# Patient Record
Sex: Male | Born: 1975 | Race: White | Hispanic: No | Marital: Married
Health system: Midwestern US, Community
[De-identification: ages and names within clinical notes are randomized; demographics above are authoritative.]

## PROBLEM LIST (undated history)

## (undated) DIAGNOSIS — R569 Unspecified convulsions: Secondary | ICD-10-CM

## (undated) DIAGNOSIS — J45909 Unspecified asthma, uncomplicated: Secondary | ICD-10-CM

## (undated) HISTORY — PX: WRIST FRACTURE SURGERY: SHX121

---

## 2017-01-11 ENCOUNTER — Encounter: Attending: Neurology | Primary: Clinical

## 2017-02-15 ENCOUNTER — Ambulatory Visit: Admit: 2017-02-15 | Discharge: 2017-02-15 | Payer: MEDICAID | Attending: Neurology | Primary: Clinical

## 2017-02-15 DIAGNOSIS — G40909 Epilepsy, unspecified, not intractable, without status epilepticus: Secondary | ICD-10-CM

## 2017-02-15 NOTE — Progress Notes (Signed)
[facility] Office Note      Patient:   Jay Jackson  MR#:    [id number]  Account Number:                         Date of Birth:   [birth year]  Date of Evaluation:  02/15/2017  Time of Note:                          1:17 PM  Primary/Referring Physician:  Genia Harold, MD  Consulting Physician:  Wallie Char, DO    NEW PATIENT CONSULTATION    Chief Complaint   Patient presents with   . New Patient     Ref. Dr Baldomero Lamy   . Seizures     Pt states he was started on Abilify and shortly after started having seizures, hx of seizures, has not been on AED therapy in over 10 years.       HISTORY OF PRESENT ILLNESS    Jay Jackson is a 41 y.o. year old male here for possible seizures.  Patient has a prior history of drug abuse.  Seizures started during childhood.  Events described as LOA followed by GTC, no urine incontinence, no tongue biting. Prior TBI history noted, no meningitis or encephalitis.  Does have a family history of seizures.  Has been on Lamictal in the past.  Has been on Keppra in the past as well.  Currently he is not on seizure medications.  Has had three events over the last year, the most recent associated with Abilify injection. He was followed in [place] previously.   Had a rash with Lamictal.     Past Medical History:   Diagnosis Date   . Arthritis    . Asthma    . Chronic pain    . CVA (cerebral vascular accident) (HCC)    . Epilepsy seizure, generalized, convulsive (HCC)    . Heart attack    . Musculoskeletal disorder    . Nasal inflammation due to allergen        Past Surgical History:   Procedure Laterality Date   . LITHOTRIPSY     . WRIST SURGERY Left        Family History   Problem Relation Age of Onset   . Heart Disease Mother    . Emphysema Mother    . Heart Attack Father    . Emphysema Father    . Diabetes Father    . Seizures Brother        Social History     Social History   . Marital status: Married     Spouse name: N/A   . Number of children: N/A   . Years of education: 9     Occupational  History   . Not on file.     Social History Main Topics   . Smoking status: Current Every Day Smoker   . Smokeless tobacco: Never Used   . Alcohol use Not on file   . Drug use: Unknown   . Sexual activity: Yes     Partners: Female     Other Topics Concern   . Not on file     Social History Narrative   . No narrative on file       Current Outpatient Prescriptions   Medication Sig Dispense Refill   . gabapentin (NEURONTIN) 600 MG tablet Take 1 tablet by mouth 3 times  daily..     . tiZANidine (ZANAFLEX) 2 MG tablet Take 1 tablet by mouth 3 times daily     . ibuprofen (ADVIL;MOTRIN) 800 MG tablet Take 1 tablet by mouth 3 times daily as needed     . famotidine (PEPCID) 20 MG tablet Take 1 tablet by mouth 3 times daily       No current facility-administered medications for this visit.        Allergies   Allergen Reactions   . Abilify [Aripiprazole] Other (See Comments)     Seizures   . Lamictal [Lamotrigine] Hives         REVIEW OF SYSTEMS    Constitutional: [] Fever [] Sweats [] Chills []  Recent Injury [] Fatigue  [x]  Denies all unless marked  HEENT:[] Headache  []  Head Injury  []  Sore Throat  []  Ear Pain  [] Dizziness []  Hearing Loss [] Trouble Swallowing [] Voice Change  []  Eye Pain  []  Eye Injection [] Visual Disturbance  []  Ptosis  []  Tinnitus [x]  Denies all unless marked  Spine:  []  Neck pain  [x]  Back pain  [x]  Sciaticia  []  Denies all unless marked  Cardiovascular:[] Chest Pain [] Palpitations []  Heart Disease  [x]  Denies all unless marked  Pulmonary: [] Shortness of Breath [] Cough  [] Wheezing  [x]  Denies all unless marked  Gastrointestinal:  [] Abdominal Pain  [] Blood in Stool  [] Diarrhea [] Constipation [] Nausea  [] Vomiting  [x]  Denies all unless marked  Genitourinary:  []  Dysuria []  Enuresis []  Incontinence []  Frequency/Urgency  []  Hematuria  [x]  Denies all unless marked  Musculoskeletal: [x]  Joint Pain [x]  Myalgias []  Joint Swelling []  Neck Stiffness  []  Denies all unless marked  Skin:[x]  Rash []  Pallor []  Color Change []   Wound  []  Denies all unless marked  Neurological:[]  Visual Disturbance []  Double Vision []  Slurred Speech []  Trouble swallowing  []  Vertigo []  Tingling []  Numbness []  Weakness []  Loss of Balance []  Loss of Consciousness []  Memory Loss []  Tremor [x]  Seizure []  Syncope  []  Ataxia  []  Denies all unless marked  Psychiatric/Behavioral:[]  Depression [x]  Anxiety []  Confusion []  Agitation []  Behavior Problems  []  Hallucinations  []  Suicidal idiation  []  Denies all unless marked  Sleep: []   Insomnia []  Sleep Disturbance []  Snoring []  Restless Legs []  Daytime Sleeping []  Sleep Apnea  [x]  Denies all unless marked  Hematological:[]  Adenopathy []  Bruises/Bleeds Easily  [x]  Denies all unless marked  Endocrine: []  Cold Intolerance []  Heat Intolerance []  Polydipsia []  Polyphagia []  Polyuria  [x] Denies all unless marked  Allergic/Immunologic:[]  Environmental Allergies []  Food Allergies []  Immunocompromised state  [x]  Denies all unless marked    PHYSICAL EXAM  BP 117/79   Pulse 70   Ht 5\' 5"  (1.651 m)   Wt 207 lb (93.9 kg)   BMI 34.45 kg/m     Constitutional - No acute distress    HEENT- Conjunctiva normal.  No scars, masses, or lesions over external nose or ears, no neck masses noted, no jugular vein distension  Cardiovascular - No clubbing, cyanosis, or edema   Pulmonary- Good expansion, normal effort without use of accessory muscles  Musculoskeletal - No significant wasting of muscles noted, no bony deformities  Skin - Warm, dry, and intact.  No rash, erythema, or pallor  Psychiatric - Mood, affect, and behavior appear normal      NEUROLOGICAL EXAM    Mental status   [x] Awake, alert, oriented   [x] Affect attention and concentration appear appropriate  [x] Recent and remote memory appears unremarkable  [x] Speech normal without dysarthria or aphasia, comprehension  and repetition intact.   COMMENTS:    Cranial Nerves [x] No VF deficit to confrontation,  no papilledema on fundoscopic exam.  [x] PERRLA, EOMI, no nystagmus, conjugate  eye movements, no ptosis  [x] Face symmetric  [x] Facial sensation intact  [x] Tongue midline no atrophy or fasciculations present  [x] Palate midline, hearing to finger rub normal bilaterally  [x] Shoulder shrug and SCM testing normal bilaterally  COMMENTS:   Motor   [x] 5/5 strength x 4 extremities  [x] Normal bulk and tone  [x] No tremor present  [x] No rigidity or bradykinesia noted  COMMENTS:   Sensory  [x] Sensation intact to light touch, pin prick, vibration, and proprioception BLE  [] Sensation intact to light touch, pin prick, vibration, and proprioception BUE  COMMENTS:   Coordination [x] FTN normal bilaterally   [x] HTS normal bilaterally  [x] RAM normal bilaterally.   COMMENTS:   Reflexes  [x] Symmetric and non-pathological  [x] Toes down going bilaterally  [x] No clonus present  COMMENTS:   Gait                  [x] Normal steady gait    [] Ataxic    [] Spastic     [] Magnetic     [] Shuffling  COMMENTS:       LABS RECORD AND IMAGING REVIEW (As below and per HPI)    Records reviewed.     ASSESSMENT:    Jay Jackson is a 41 y.o. year old male here for recurrent seizure like episodes.  Not currently on treatment, has failed lamictal and keppra in the past. Question non-epileptic events, question if prior drug use may have played a role. Needs clarified.     PLAN:  1.  MRI Brain.  2.  Video EEG monitoring.  3.  Consider AEDs pending above, hold off for now.   4.  Epilepsy precautions and seizure first aid discussed.  No driving, heights, swimming, tub baths, open flames, or heavy machinery.      Wallie CharJimmy E. Shylee Durrett, DO  Board Certified Neurology

## 2017-02-26 ENCOUNTER — Inpatient Hospital Stay: Admit: 2017-02-26 | Payer: MEDICAID | Primary: Clinical

## 2017-02-26 DIAGNOSIS — G40909 Epilepsy, unspecified, not intractable, without status epilepticus: Secondary | ICD-10-CM

## 2017-02-26 MED ORDER — GADOBENATE DIMEGLUMINE 529 MG/ML IV SOLN
529 MG/ML | Freq: Once | INTRAVENOUS | Status: AC | PRN
Start: 2017-02-26 — End: 2017-02-26
  Administered 2017-02-26: 13:00:00 19 mL via INTRAVENOUS

## 2017-02-26 NOTE — Progress Notes (Signed)
Ok thank you

## 2017-05-18 NOTE — Telephone Encounter (Signed)
Patient called with question on his appointment 05/19/17. The patient had his MRI but not the EEG. The patient is scheduled for that on 05/31/17. Cancelled tomorrows appointment and rescheduled for the week after.

## 2017-05-19 ENCOUNTER — Encounter: Attending: Neurology | Primary: Clinical

## 2017-05-31 ENCOUNTER — Observation Stay
Admit: 2017-05-31 | Discharge: 2017-06-02 | Disposition: A | Discharge status: Home / Self Care | Payer: MEDICAID | Source: Ambulatory Visit | Attending: Neurology | Admitting: Neurology | Primary: Clinical

## 2017-05-31 DIAGNOSIS — R569 Unspecified convulsions: Secondary | ICD-10-CM

## 2017-05-31 MED ORDER — LORAZEPAM 2 MG/ML IJ SOLN
2 MG/ML | INTRAMUSCULAR | Status: DC | PRN
Start: 2017-05-31 — End: 2017-06-02

## 2017-05-31 MED ORDER — NORMAL SALINE FLUSH 0.9 % IV SOLN
0.9 % | Freq: Two times a day (BID) | INTRAVENOUS | Status: DC
Start: 2017-05-31 — End: 2017-06-02

## 2017-05-31 MED ORDER — TIZANIDINE HCL 4 MG PO TABS
4 MG | Freq: Three times a day (TID) | ORAL | Status: DC | PRN
Start: 2017-05-31 — End: 2017-06-02
  Administered 2017-06-01 – 2017-06-02 (×2): 4 mg via ORAL

## 2017-05-31 MED ORDER — NICOTINE 21 MG/24HR TD PT24
21 MG/24HR | Freq: Every day | TRANSDERMAL | Status: DC
Start: 2017-05-31 — End: 2017-06-02
  Administered 2017-05-31 – 2017-06-02 (×3): 1 via TRANSDERMAL

## 2017-05-31 MED ORDER — GABAPENTIN 600 MG PO TABS
600 MG | Freq: Three times a day (TID) | ORAL | Status: DC
Start: 2017-05-31 — End: 2017-06-02
  Administered 2017-05-31 – 2017-06-02 (×6): 600 mg via ORAL

## 2017-05-31 MED ORDER — MAGNESIUM HYDROXIDE 400 MG/5ML PO SUSP
400 | Freq: Every day | ORAL | Status: DC | PRN
Start: 2017-05-31 — End: 2017-06-02

## 2017-05-31 MED ORDER — FAMOTIDINE 20 MG PO TABS
20 MG | Freq: Two times a day (BID) | ORAL | Status: DC
Start: 2017-05-31 — End: 2017-06-02
  Administered 2017-06-01 – 2017-06-02 (×4): 20 mg via ORAL

## 2017-05-31 MED ORDER — ENOXAPARIN SODIUM 40 MG/0.4ML SC SOLN
40 MG/0.4ML | Freq: Every day | SUBCUTANEOUS | Status: DC
Start: 2017-05-31 — End: 2017-06-02
  Administered 2017-06-01: 03:00:00 40 mg via SUBCUTANEOUS

## 2017-05-31 MED ORDER — IBUPROFEN 400 MG PO TABS
400 MG | Freq: Three times a day (TID) | ORAL | Status: DC | PRN
Start: 2017-05-31 — End: 2017-06-02
  Administered 2017-06-01 – 2017-06-02 (×2): 800 mg via ORAL

## 2017-05-31 MED ORDER — NORMAL SALINE FLUSH 0.9 % IV SOLN
0.9 % | INTRAVENOUS | Status: DC | PRN
Start: 2017-05-31 — End: 2017-06-02

## 2017-05-31 MED ORDER — ONDANSETRON HCL 4 MG/2ML IJ SOLN
4 MG/2ML | Freq: Four times a day (QID) | INTRAMUSCULAR | Status: DC | PRN
Start: 2017-05-31 — End: 2017-06-02

## 2017-05-31 NOTE — Progress Notes (Signed)
4 Eyes Skin Assessment    Jay Jackson is being assessed upon: Admission    I agree that I, [name], along with Cyndee Brightly, RN (either 2 RN's or 1 LPN and 1 RN) have performed a thorough Head to Toe Skin Assessment on the patient. ALL assessment sites listed below have been assessed.      Areas assessed by both nurses:     [x]    Head, Face, and Ears   [x]    Shoulders, Back, and Chest  [x]    Arms, Elbows, and Hands   [x]    Coccyx, Sacrum, and Ischium  [x]    Legs, Feet, and Heels    Does the Patient have Skin Breakdown? No    Braden Prevention initiated: No  Wound Care Orders initiated: No    WOC nurse consulted for Pressure Injury (Stage 3,4, Unstageable, DTI, NWPT, and Complex wounds) and New or Established Ostomies: No        Primary Nurse eSignature: [name], RN on 05/31/2017 at 9:35 AM      Co-Signer eSignature:

## 2017-05-31 NOTE — Plan of Care (Signed)
Problem: Falls - Risk of:  Goal: Will remain free from falls  Will remain free from falls  Outcome: Ongoing    Goal: Absence of physical injury  Absence of physical injury  Outcome: Ongoing

## 2017-05-31 NOTE — Progress Notes (Signed)
[place] arrived to room # 334.   Presented with: EEG monitoring   Mental Status: Patient is oriented and alert.   Vitals:    05/31/17 0853   BP: 126/83   Pulse: 63   Resp: 16   Temp: 97.5 F (36.4 C)   SpO2: 96%     Patient safety contract and falls prevention contract reviewed with patient Yes.  Oriented Patient to room.  Call light within reach. Yes.  Needs, issues or concerns expressed at this time: no.      Electronically signed by [name], RN on 05/31/2017 at 9:34 AM

## 2017-05-31 NOTE — H&P (Signed)
HISTORY AND PHYSICAL      Patient:   Jay Jackson  MR#:    [id number]  Account Number:                   [id number]      Room:    0334/334-01   Date of Birth:   Oct 06, 1975  Date of Progress Note: 05/31/2017  Time of Note                           12:23 PM  Attending Physician:  Wallie Char, D.O.       CHIEF COMPLAINT:  Convulsions    HISTORY OF PRESENT ILLNESS:   This 41 y.o. male who presents with possible seizures.   He has a prior history of drug abuse. His seizure started during childhood. Events are described as loss of awareness followed by generalized tonic-clonic convulsive activity. He denies incontinence or tongue biting. He does have a prior treatment brain injury history. There is no meningitis or encephalitis. There is no clear family history of seizures. He is currently not on antiepileptic medications. He is being admitted to epilepsy monitoring unit for event clarification.    REVIEW OF SYSTEMS    Constitutional:  Denies fever or chills   Eyes:  Denies change in visual acuity   HENT:  Denies nasal congestion or sore throat   Respiratory:  Denies cough or shortness of breath   Cardiovascular:  Denies chest pain or edema   GI:  Denies abdominal pain, nausea, vomiting, bloody stools or diarrhea   GU:  Denies dysuria   Musculoskeletal:  Denies back pain or joint pain   Integument:  Denies rash   Neurologic:  Denies headache, focal weakness or sensory changes   Endocrine:  Denies polyuria or polydipsia   Lymphatic:  Denies swollen glands   Psychiatric:  Dpression / anxiety  Noted, no SI/HI.    All other review of systems are negative.    Past Medical History:  Past Medical History:   Diagnosis Date   . Arthritis    . Asthma    . Chronic pain    . CVA (cerebral vascular accident) (HCC)    . Epilepsy seizure, generalized, convulsive (HCC)    . Heart attack (HCC)    . Musculoskeletal disorder    . Nasal inflammation due to allergen        Past Surgical History:      Procedure Laterality Date   .  LITHOTRIPSY     . WRIST SURGERY Left        Social History:   TOBACCO:   reports that he has been smoking.  He has never used smokeless tobacco.  ETOH:   has no alcohol history on file.  DRUG:  None    Family History:   Family History   Problem Relation Age of Onset   . Heart Disease Mother    . Emphysema Mother    . Heart Attack Father    . Emphysema Father    . Diabetes Father    . Seizures Brother        Medications:      Current Facility-Administered Medications:   .  famotidine (PEPCID) tablet 20 mg, 20 mg, Oral, BID, Voncille Lo, DO  .  gabapentin (NEURONTIN) tablet 600 mg, 600 mg, Oral, TID, Voncille Lo, DO  .  ibuprofen (ADVIL;MOTRIN) tablet 800 mg, 800 mg, Oral, TID PRN, Rosanne Ashing  Ed Knute Neu, DO  .  tiZANidine (ZANAFLEX) tablet 4 mg, 4 mg, Oral, Q8H PRN, Voncille Lo, DO  .  nicotine (NICODERM CQ) 21 MG/24HR 1 patch, 1 patch, Transdermal, Daily, Voncille Lo, DO  .  sodium chloride flush 0.9 % injection 10 mL, 10 mL, Intravenous, 2 times per day, Voncille Lo, DO  .  sodium chloride flush 0.9 % injection 10 mL, 10 mL, Intravenous, PRN, Voncille Lo, DO  .  magnesium hydroxide (MILK OF MAGNESIA) 400 MG/5ML suspension 30 mL, 30 mL, Oral, Daily PRN, Voncille Lo, DO  .  ondansetron [facility]) injection 4 mg, 4 mg, Intravenous, Q6H PRN, Voncille Lo, DO  .  enoxaparin (LOVENOX) injection 40 mg, 40 mg, Subcutaneous, Daily, Voncille Lo, DO  .  LORazepam (ATIVAN) injection 1 mg, 1 mg, Intravenous, PRN, Voncille Lo, DO    Allergies:  Abilify [aripiprazole] and Lamictal [lamotrigine]    PHYSICAL EXAM:    Constitutional -   BP 126/83   Pulse 63   Temp 97.5 F (36.4 C) (Temporal)   Resp 16   Ht  (1.651 m)   Wt 194 lb (88 kg)   SpO2 96%   BMI 32.28 kg/m   General appearance: No acute distress   EYES -   Conjunctiva normal  Pupillary exam as below, see CN exam in the neurologic exam  ENT-    No scars, masses, or lesions over external nose or ears  Hearing normal bilaterally to finger rub  Cardiovascular -    RRR  No clubbing, cyanosis, or edema   Respiratory-   Good expansion, normal effort without use of accessory muscles  CTA  Musculoskeletal -   No significant wasting of muscles noted  Gait as below, see gait exam in the neurologic exam  Muscle strength, tone, stability as below.   No bony deformities  Skin -   Warm, dry, and intact to inspection and palpation.    No rash, erythema, or pallor  Psychiatric -   Mood, affect, and behavior appear normal    Memory as below see mental status examination in the neurologic exam      NEUROLOGICAL EXAM    Mental status   Awake, alert, oriented   Affect attention and concentration appear appropriate  Recent and remote memory appears unremarkable  Speech normal without dysarthria or aphasia, comprehension and repetition intact.   COMMENTS:    Cranial Nerves No VF deficit to confrontation,  no papilledema on fundoscopic exam.  PERRLA, EOMI, no nystagmus, conjugate eye movements, no ptosis  Face symmetric  Facial sensation intact  Tongue midline no atrophy or fasciculations present  Palate midline, hearing to finger rub normal  Shoulder shrug and SCM testing normal  COMMENTS:   Motor   5/5 strength x 4 extremities  Normal bulk and tone  No tremor present  No rigidity or bradykinesia noted  COMMENTS:   Sensory  Sensation intact to light touch, pin prick, vibration, and proprioception BLE  Sensation intact to light touch, pin prick, vibration, and proprioception BUE  COMMENTS:   Coordination FTN normal bilaterally   HTS normal bilaterally  RAM normal.   COMMENTS:   Reflexes  Symmetric and non-pathological  Toes down going bilaterally  No clonus present  COMMENTS:   Gait                  Normal steady gait    Ataxic    Spastic       Magnetic     [] Shuffling  COMMENTS:     LABS AND IMAGING:  As below and per HPI.    MRI brain with chiari otherwise negative.     ASSESSMENT:  41 y.o. here with recurrent seizure  like episodes and admitted for epilepsy monitoring and event clarification.     PLAN:  1.  Video EEG monitoring 48-72 hours.   2.  Seizure precautions, prn ativan.  3.  Final AED recommendations pending above.   4.  Continue home medication regimen, not currently on AEDs.     Ellison Carwin, DO  Board Certified Neurologist

## 2017-06-01 NOTE — Plan of Care (Signed)
Problem: Falls - Risk of:  Goal: Will remain free from falls  Will remain free from falls   Outcome: Ongoing    Goal: Absence of physical injury  Absence of physical injury   Outcome: Ongoing      Problem: Safety:  Goal: Free from accidental physical injury  Free from accidental physical injury   Outcome: Ongoing    Goal: Free from intentional harm  Free from intentional harm   Outcome: Ongoing    Goal: Ability to remain free from injury will improve  Ability to remain free from injury will improve   Outcome: Ongoing

## 2017-06-01 NOTE — Progress Notes (Signed)
Patient called out stating he had a seizure, not sure how long it lasted states "i was watching tv and all of a sudden it hit" Time frame between 0700-0720. Will send message to Dr. Knute Neuouch

## 2017-06-01 NOTE — Plan of Care (Signed)
Problem: Falls - Risk of:  Goal: Will remain free from falls  Will remain free from falls   Outcome: Ongoing    Goal: Absence of physical injury  Absence of physical injury   Outcome: Ongoing      Problem: Infection:  Goal: Will remain free from infection  Will remain free from infection   Outcome: Ongoing      Problem: Safety:  Goal: Free from accidental physical injury  Free from accidental physical injury   Outcome: Ongoing    Goal: Free from intentional harm  Free from intentional harm   Outcome: Ongoing    Goal: Ability to remain free from injury will improve  Ability to remain free from injury will improve   Outcome: Ongoing      Problem: Daily Care:  Goal: Daily care needs are met  Daily care needs are met   Outcome: Ongoing      Problem: Pain:  Goal: Patient's pain/discomfort is manageable  Patient's pain/discomfort is manageable   Outcome: Ongoing      Problem: Skin Integrity:  Goal: Skin integrity will stabilize  Skin integrity will stabilize   Outcome: Ongoing      Problem: Discharge Planning:  Goal: Patients continuum of care needs are met  Patients continuum of care needs are met   Outcome: Ongoing      Problem: ABCDS Injury Assessment  Goal: Absence of physical injury  Outcome: Ongoing      Problem: Coping:  Goal: Ability to cope will improve  Ability to cope will improve   Outcome: Ongoing    Goal: Ability to identify appropriate support needs will improve  Ability to identify appropriate support needs will improve   Outcome: Ongoing      Problem: Health Behavior:  Goal: Ability to manage health-related needs will improve  Ability to manage health-related needs will improve   Outcome: Ongoing      Problem: Physical Regulation:  Goal: Signs of adequate cerebral perfusion will increase  Signs of adequate cerebral perfusion will increase   Outcome: Ongoing    Goal: Ability to maintain a stable neurologic state will improve  Ability to maintain a stable neurologic state will improve   Outcome:  Ongoing      Problem: Self-Concept:  Goal: Level of anxiety will decrease  Level of anxiety will decrease   Outcome: Ongoing    Goal: Ability to verbalize feelings about condition will improve  Ability to verbalize feelings about condition will improve   Outcome: Ongoing

## 2017-06-01 NOTE — Progress Notes (Signed)
[facility] Progress Note      Patient:   Jay Jackson  MR#:    [id number]   Room:    1478/295-62   Date of Birth:   1975/11/14  Date of Progress Note: 06/01/2017  Time of Note                           8:33 AM  Attending Physician:  Voncille Lo, DO      INTERVAL HISTORY:  No events overnight, did have a brief spell this am, no acute events, no new complaints this am outside of mild dizziness.     REVIEW OF SYSTEMS:  Constitutional: No fevers No chills  Neck:No stiffness  Respiratory: No shortness of breath  Cardiovascular: No chest pain No palpitations  Gastrointestinal: No abdominal pain    Genitourinary: No Dysuria  Neurological: No headache, no confusion    PHYSICAL EXAM:    Constitutional -   BP 105/76   Pulse 62   Temp 98.8 F (37.1 C)   Resp 20   Ht  (1.651 m)   Wt 194 lb (88 kg)   SpO2 95%   BMI 32.28 kg/m   General appearance: No acute distress   EYES -   Conjunctiva normal  Pupillary exam as below, see CN exam in the neurologic exam  ENT-    No scars, masses, or lesions over external nose or ears  Hearing normal bilaterally to finger rub  Neck-   No neck masses noted  Thyroid normal   No jugular vein distension  Cardiovascular -   RRR  No clubbing, cyanosis, or edema   Pulmonary-   Good expansion, normal effort without use of accessory muscles  CTA  Musculoskeletal -   No significant wasting of muscles noted  Gait as below, see gait exam in the neurologic exam  Muscle strength, tone, stability as below see the motor exam in the neurologic exam.   No bony deformities  Skin -   Warm, dry, and intact to inspection and palpation.    No rash, erythema, or pallor  Psychiatric -   Mood, affect, and behavior appear normal    Memory as below see mental status examination in the neurologic exam      NEUROLOGICAL EXAM    Mental status    Awake, alert, oriented    Affect attention and concentration appear appropriate   Recent and remote memory appears unremarkable   Speech normal  without dysarthria or aphasia, comprehension and repetition intact.   COMMENTS:   Cranial Nerves  No VF deficit to confrontation,  no papilledema on fundoscopic exam.   PERRLA, EOMI, no nystagmus, conjugate eye movements, no ptosis   Face symmetric   Facial sensation intact   Tongue midline no atrophy or fasciculations present   Palate midline, hearing to finger rub normal   Shoulder shrug and SCM testing normal  COMMENTS:   Motor    5/5 strength x 4 extremities   Normal bulk and tone   No tremor present   No rigidity or bradykinesia noted  COMMENTS:   Sensory   Sensation intact to light touch, pin prick, vibration, and proprioception BLE   Sensation intact to light touch, pin prick, vibration, and proprioception BUE  COMMENTS:   Coordination  FTN normal bilaterally    HTS normal bilaterally   RAM normal.   COMMENTS:   Reflexes   Symmetric and non-pathological   Toes downgoing  bilaterally  [x]  No clonus present  COMMENTS:   Gait                  []  Normal steady gait    []  Ataxic    []  Spastic     []  Magnetic     []  Shuffling  [x]  Not assessed  COMMENTS:       RECORD REVIEW:   Previous medical records, medications were reviewed at today's visit.  Nursing/physician notes, imaging, labs and vitals reviewed.   PT,OT and/or speech notes reviewed      ASSESSMENT:  41 y.o. here with recurrent seizure like episodes and admitted for epilepsy monitoring and event clarification. Only a single brief event captured thus far.     PLAN:  1.  Continue Video EEG monitoring 72-96 hours.   2.  Seizure precautions, prn ativan.  3.  Final AED recommendations pending above.   4.  Continue home medication regimen, not currently on AEDs.       Please feel free to call with any questions.   [phone] (cell phone).    Ellison Carwin, DO  Board Certified Neurology

## 2017-06-02 NOTE — Plan of Care (Signed)
Problem: Falls - Risk of:  Goal: Will remain free from falls  Will remain free from falls   Outcome: Ongoing    Goal: Absence of physical injury  Absence of physical injury   Outcome: Ongoing      Problem: Infection:  Goal: Will remain free from infection  Will remain free from infection   Outcome: Ongoing      Problem: Safety:  Goal: Free from accidental physical injury  Free from accidental physical injury   Outcome: Ongoing    Goal: Free from intentional harm  Free from intentional harm   Outcome: Ongoing    Goal: Ability to remain free from injury will improve  Ability to remain free from injury will improve   Outcome: Ongoing      Problem: Daily Care:  Goal: Daily care needs are met  Daily care needs are met   Outcome: Ongoing      Problem: Pain:  Goal: Patient's pain/discomfort is manageable  Patient's pain/discomfort is manageable   Outcome: Ongoing      Problem: Skin Integrity:  Goal: Skin integrity will stabilize  Skin integrity will stabilize   Outcome: Ongoing      Problem: Discharge Planning:  Goal: Patients continuum of care needs are met  Patients continuum of care needs are met   Outcome: Ongoing      Problem: ABCDS Injury Assessment  Goal: Absence of physical injury  Outcome: Ongoing      Problem: Coping:  Goal: Ability to cope will improve  Ability to cope will improve   Outcome: Ongoing    Goal: Ability to identify appropriate support needs will improve  Ability to identify appropriate support needs will improve   Outcome: Ongoing      Problem: Health Behavior:  Goal: Ability to manage health-related needs will improve  Ability to manage health-related needs will improve   Outcome: Ongoing      Problem: Physical Regulation:  Goal: Signs of adequate cerebral perfusion will increase  Signs of adequate cerebral perfusion will increase   Outcome: Ongoing    Goal: Ability to maintain a stable neurologic state will improve  Ability to maintain a stable neurologic state will improve   Outcome:  Ongoing      Problem: Self-Concept:  Goal: Level of anxiety will decrease  Level of anxiety will decrease   Outcome: Ongoing    Goal: Ability to verbalize feelings about condition will improve  Ability to verbalize feelings about condition will improve   Outcome: Ongoing

## 2017-06-02 NOTE — Discharge Summary (Signed)
EPILEPSY MONITORING UNIT DISCHARGE SUMMARY      Patient Name: Jay Jackson    DOB:  Apr 02, 1976    MRN:  [id number]  Admit date:  05/31/2017    Discharge date:  06/02/2017  Admitting Physician:  Voncille LoJim Ed Zeena Starkel, DO    Primary Care Physician:  Genia HaroldSandy K Stringer    Discharge Diagnoses:  Convulsions    Diagnostic Studies: 24 hour video EEG monitoring    Hospital Course: The patient was admitted to epilepsy monitoring unit. Multiple brief confusional episodes were captured. None of which were associated with loss of awareness or clinical seizure activity. No clear epileptiform MRIs were noted on the EEG. Patient be discharged home today. He'll follow-up with me in 1-2 weeks. Final review his interictal EEG will be completed. Final antiepileptic drug recommendations we made at that time. She will continue to follow event precautions as outlined below.    Discharge Medications:      Jay Pernaeal, [place]   Home Medication Instructions JYN:[id number]    Printed on:06/02/17 1202   Medication Information                      famotidine (PEPCID) 20 MG tablet  Take 1 tablet by mouth 3 times daily             gabapentin (NEURONTIN) 600 MG tablet  Take 1 tablet by mouth 3 times daily.Marland Kitchen.             ibuprofen (ADVIL;MOTRIN) 800 MG tablet  Take 1 tablet by mouth 3 times daily as needed             tiZANidine (ZANAFLEX) 2 MG tablet  Take 4 mg by mouth 2 times daily                  Discharge Instructions:   Follow up with Ellison CarwinJimmy Trase Bunda in 7 days.  Take medications as directed.  Resume activity as tolerated.  Epilepsy precautions and seizure first aid discussed.  No driving, heights, swimming, tub baths, open flames, or heavy machinery.      Disposition: Patient is medically stable and will be discharged home.    Time spent on discharge 20 min.      Ellison CarwinJimmy Samuel Rittenhouse, DO  Board Certified Neurologist

## 2017-06-02 NOTE — Discharge Instructions (Signed)
As tolerated.

## 2017-06-02 NOTE — Procedures (Signed)
ADULT INPATIENT VIDEO-EEG EVENT MONITORING REPORT    Patient:   Jay Jackson  MR#:    [id number]  Room #:    INPATIENT   Date of Birth:   1976-08-26  Study Date:    September 24-26, 2018  Primary Physician:     Genia HaroldSandy K Stringer   Referring Physician:   Ellison CarwinJimmy Hollin Crewe, DO      CLINICAL INFORMATION:     This patient is a 41 y.o. male with a history of possible seizures.  The specific reason we are doing this study is for event clarification and to evaluate for an underlying seizure focus.     MEDICATIONS:     See MAR    RECORDING CONDITIONS:     Continuous video-EEG monitoring was performed with [employer] equiptment. The recorded period extended from September 24-26, 2018. Electrodes were applied according to the International 10-20 System. Data were obtained, stored, and interpreted according to ACNS guidelines (J Clin Neurophysiol 2006;23(2):85-183) utilizing referential montage recording, with reformatting to longitudinal, transverse bipolar, and referential montages as necessary for interpretation, along with digital/automated EEG analysis. The patient tolerated the entire procedure well.     DESCRIPTION OF BASELINE RECORD:    During the waking state, the predominant posterior resting frequency was rhythmic, symmetric, 9-10 Hz, 30-40 uV rhythm with reactivity to eye opening and closure demonstrated. Drowsiness was demonstrated by slow rolling eye movements followed by loss of background waking activities. Onset of Stage I sleep was demonstrated by gradual disappearance of background waking rhythms and the onset of gradual symmetric mixed-frequency 4-7 Hz slowing. Stage II sleep was characterized by vertex transients, sleep-spindles, and K-complexes, at times with shifting asymmetry demonstrated. Stage III and IV of sleep were characterized by progressive proportion of high voltage slowing in the delta frequency. Photic stimulation was performed at frequencies between 1-30 Hz demonstrating symmetric bioccipital driving  responses at a wide range of frequencies. No ECG abnormalities were noted. Muscle, motion, and eye movement artifacts were occasionally noted.     DESCRIPTION OF EVENTS:    May 31, 2017  The Interical files were as described in trhe baseline recording.   Digital spike and event analysis reviewed and showed no abnormalities.   No events were recorded.     June 01, 2017  The Interical files were as described in trhe baseline recording.   Digital spike and event analysis reviewed and showed no abnormalities.   No events were recorded.     June 02, 2017  The Interical files were as described in trhe baseline recording.   Digital spike and event analysis reviewed and showed no abnormalities.   No events were recorded.       E.E.G. INTERPRETATION:    1. Interictal Abnormalities: None.     2. Ictal Events: None.     3. Non-Epileptic Events: Multiple brief confusional episodes were captured.  No overt EEG abnormalities were noted.       CLINICAL CORRELATION:    This 3-day study was non-diagnostic.  The patients typical events were captured. No interictal abnormalities were noted.  The events that were captured appeared non-epileptic. The absence of epileptiform abnormalities does not preclude a clinical diagnosis of seizures.        Ellison CarwinJimmy Sherion Dooly, DO  Board Certified Neurologist    Date Reported: 06/02/2017  Date Signed: 06/02/2017

## 2017-06-02 NOTE — Discharge Instructions (Signed)
Epilepsy precautions and seizure first aid discussed.  No driving, heights, swimming, tub baths, open flames, or heavy machinery.

## 2017-06-08 ENCOUNTER — Ambulatory Visit: Admit: 2017-06-08 | Discharge: 2017-06-08 | Payer: MEDICAID | Attending: Neurology | Primary: Clinical

## 2017-06-08 DIAGNOSIS — R569 Unspecified convulsions: Secondary | ICD-10-CM

## 2017-06-08 NOTE — Progress Notes (Signed)
[facility] Office Note      Patient:   Jay Jackson  MR#:    [id number]  Account Number:                         Date of Birth:   1976/02/23  Date of Evaluation:  06/08/2017  Time of Note:                          11:30 AM  Primary/Referring Physician:  Genia Harold, MD  Consulting Physician:  Wallie Char, DO    FOLLOW UP VISIT    Chief Complaint   Patient presents with   . Seizures     3 month follow up last seizure was 06-03-2017   . Results     Results from EEG       HISTORY OF PRESENT ILLNESS    Jay Jackson is a 41 y.o. year old male here for possible seizures.  Patient has a prior history of drug abuse.  Seizures started during childhood.  Events described as LOA followed by GTC, no urine incontinence, no tongue biting. Prior TBI history noted, no meningitis or encephalitis.  Does have a family history of seizures.  Has been on Lamictal in the past.  Has been on Keppra in the past as well.  Video EEG completed and largely negative, a couple brief confusional episodes captured, without EEG abnormalities noted.  Only a couple brief events since discharged.  Currently he is not on seizure medications.  Has had three events over the last year, the most recent associated with Abilify injection. He was followed in [place] previously.   Had a rash with Lamictal. No other complaints today. 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     Past Medical History:   Diagnosis Date   . Arthritis    . Asthma    . Chronic pain    . CVA (cerebral vascular accident) (HCC)    . 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 Epilepsy seizure, generalized, convulsive (HCC)    . Heart attack (HCC)    . Musculoskeletal disorder    . Nasal inflammation due to allergen        Past Surgical History:   Procedure Laterality Date   . LITHOTRIPSY     . WRIST SURGERY Left        Family History   Problem Relation Age of Onset   . Heart Disease Mother    . Emphysema Mother    . Heart Attack Father    . Emphysema Father    .  Diabetes Father    . Seizures Brother        Social History     Social History   . Marital status: Married     Spouse name: N/A   . Number of children: N/A   . Years of education: 9     Occupational History   . Not on file.     Social History Main Topics   . Smoking status: Current Every Day Smoker   . Smokeless tobacco: Never Used   . Alcohol use No   . Drug use: No      Comment: prior substant abbuse/meth    . Sexual activity: Yes     Partners: Female     Other Topics Concern   . Not on file  Social History Narrative   . No narrative on file       Current Outpatient Prescriptions   Medication Sig Dispense Refill   . albuterol (PROVENTIL) (2.5 MG/3ML) 0.083% nebulizer solution as needed     . cetirizine-psuedoephedrine (ZYRTEC-D) 5-120 MG per extended release tablet as needed     . gabapentin (NEURONTIN) 600 MG tablet Take 1 tablet by mouth 3 times daily..     . tiZANidine (ZANAFLEX) 2 MG tablet Take 4 mg by mouth 2 times daily      . ibuprofen (ADVIL;MOTRIN) 800 MG tablet Take 1 tablet by mouth 3 times daily as needed     . famotidine (PEPCID) 20 MG tablet Take 1 tablet by mouth 3 times daily       No current facility-administered medications for this visit.        Allergies   Allergen Reactions   . Abilify [Aripiprazole] Other (See Comments)     Seizures   . Lamictal [Lamotrigine] Hives     REVIEW OF SYSTEMS    Constitutional: Fever Sweat Chills  Recent Injury  Denies all unless marked  HEENT:[] Headache   Head Injury/Hearing Loss   Sore Throat   Ear Ache/Dizziness   Denies all unless marked  Spine:   Neck pain   Back pain   Sciaticia   Denies all unless marked  Cardiovascular:[] Heart Disease Chest Pain  Palpitations   Denies all unless marked  Pulmonary: Shortness of Breath Cough    Denies all unless marke  Gastrointestinal: Nausea  Vomiting  Abdominal Pain  Constipation  Diarrhea  Dark Bloody Stools   Denies all unless  marked  Psychiatric/Behavioral:[]  Depression  Anxiety  Denies all unless marked  Genitourinary:    Frequency   Urgency   Incontinence  Pain with Urination   Denies all unless marked  Extremities: Pain  Swelling   Denies all unless marked  Musculoskeletal:  Muscle Pain   Joint Pain   Arthritis  Muscle Cramps  Muscle Twitches   Denies all unless marked  Sleep:  Insomnia  Snoring  Restless Legs  Sleep Apnea   Daytime Sleepiness   Denies all unless marked  Skin:[]  Rash  Skin Discoloration  Denies all unless marked   Neurological: Visual Disturbance/Memory Loss  Loss of Balance  Slurred Speech/Weakness  Seizures   Vertigo/Dizziness  Denies all unless marked    ROS reviewed, agree with above.       PHYSICAL EXAM  Constitutional -   BP 124/68   Pulse 82   Ht  (1.651 m)   Wt 195 lb 9.6 oz (88.7 kg)   SpO2 96%   BMI 32.55 kg/m   General appearance: No acute distress   EYES -   Conjunctiva normal  Pupillary exam as below, see CN exam in the neurologic exam  ENT-    No scars, masses, or lesions over external nose or ears  Hearing normal bilaterally to finger rub  Cardiovascular -   RRR  No clubbing, cyanosis, or edema   Respiratory-   Good expansion, normal effort without use of accessory muscles  CTA  Musculoskeletal -   No significant wasting of muscles noted  Gait as below, see gait exam in the neurologic exam  Muscle strength, tone, stability as below.   No bony deformities  Skin -   Warm, dry, and intact to inspection and palpation.    No rash, erythema, or pallor  Psychiatric -   Mood, affect, and behavior appear normal  Memory as below see mental status examination in the neurologic exam      NEUROLOGICAL EXAM    Mental status   Awake, alert, oriented   Affect attention and concentration appear appropriate  Recent and remote memory appears unremarkable  Speech normal without dysarthria or aphasia, comprehension and repetition  intact.   COMMENTS:    Cranial Nerves No VF deficit to confrontation,  no papilledema on fundoscopic exam.  PERRLA, EOMI, no nystagmus, conjugate eye movements, no ptosis  Face symmetric  Facial sensation intact  Tongue midline no atrophy or fasciculations present  Palate midline, hearing to finger rub normal bilaterally  Shoulder shrug and SCM testing normal bilaterally  COMMENTS:   Motor   5/5 strength x 4 extremities  Normal bulk and tone  No tremor present  No rigidity or bradykinesia noted  COMMENTS:   Sensory  Sensation intact to light touch, pin prick, vibration, and proprioception BLE  Sensation intact to light touch, pin prick, vibration, and proprioception BUE  COMMENTS:   Coordination FTN normal bilaterally   HTS normal bilaterally  RAM normal bilaterally.   COMMENTS:   Reflexes  Symmetric and non-pathological  Toes down going bilaterally  No clonus present  COMMENTS:   Gait                  Normal steady gait    Ataxic    Spastic     Magnetic     Shuffling  COMMENTS:       LABS RECORD AND IMAGING REVIEW (As below and per HPI)    Records reviewed.     Video EEG reviewed.     MRI reviewed.     ASSESSMENT:    Jay Jackson is a 41 y.o. year old male here for recurrent seizure like episodes.  Video EEG was largely negative, brief non-epileptic events captured. Not currently on treatment, has failed lamictal and keppra in the past. Question if prior drug use may have played a role.  MRI c/w chiari, otherwise negative.     PLAN:  1.  Hold off on AEDs for now given negative workup.  2.  Follow chiari, consider surgery evaluation, but currently not felt symptomatic.    3.  Event precautions discussed.  No driving, heights, swimming, tub baths, open flames, or heavy machinery.      Wallie Char, DO  Board Certified Neurology

## 2017-10-11 ENCOUNTER — Ambulatory Visit: Admit: 2017-10-11 | Discharge: 2017-10-11 | Payer: MEDICAID | Attending: Neurology | Primary: Clinical

## 2017-10-11 DIAGNOSIS — R569 Unspecified convulsions: Secondary | ICD-10-CM

## 2017-10-11 NOTE — Progress Notes (Signed)
[facility] Office Note      Patient:   Jay Jackson  MR#:    [id number]  Account Number:                         Date of Birth:   1975-10-15  Date of Evaluation:  10/11/2017  Time of Note:                          8:46 AM  Primary/Referring Physician:  Genia Harold, MD  Consulting Physician:  Wallie Char, DO    FOLLOW UP VISIT    Chief Complaint   Patient presents with   . Seizures     4 month follow up no saeizures to report   . Migraine     Having about 2 a week        HISTORY OF PRESENT ILLNESS    Jay Jackson is a 42 y.o. year old male here for possible seizures.  Doing well since last seen.  No further events since last seen.   Patient has a prior history of drug abuse.  Seizures started during childhood.  Events described as LOA followed by GTC, no urine incontinence, no tongue biting. Prior TBI history noted, no meningitis or encephalitis.  Does have a family history of seizures.  Has been on Lamictal in the past.  Has been on Keppra in the past as well.  Video EEG completed and largely negative, a couple brief confusional episodes captured, without EEG abnormalities noted.  Not on seizure medications.  Has had three events over the last year, the most recent associated with Abilify injection. He was followed in [place] previously.   Had a rash with Lamictal. No other complaints today. 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 Mild headaches noted.     Past Medical History:   Diagnosis Date   . Arthritis    . Asthma    . Chronic pain    . CVA (cerebral vascular accident) (HCC)    . 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 Epilepsy seizure, generalized, convulsive (HCC)    . Heart attack (HCC)    . Musculoskeletal disorder    . Nasal inflammation due to allergen        Past Surgical History:   Procedure Laterality Date   . LITHOTRIPSY     . WRIST SURGERY Left        Family History   Problem Relation Age of Onset   . Heart Disease Mother    . Emphysema Mother    . Heart Attack Father     . Emphysema Father    . Diabetes Father    . Seizures Brother        Social History     Social History   . Marital status: Married     Spouse name: N/A   . Number of children: N/A   . Years of education: 9     Occupational History   . Not on file.     Social History Main Topics   . Smoking status: Current Every Day Smoker   . Smokeless tobacco: Never Used   . Alcohol use No   . Drug use: No      Comment: prior substant abbuse/meth    . Sexual activity: Yes     Partners: Female  Other Topics Concern   . Not on file     Social History Narrative   . No narrative on file       Current Outpatient Prescriptions   Medication Sig Dispense Refill   . traZODone (DESYREL) 100 MG tablet Take 100 mg by mouth nightly     . SUMAtriptan (IMITREX) 25 MG tablet Take 25 mg by mouth once as needed for Migraine     . albuterol (PROVENTIL) (2.5 MG/3ML) 0.083% nebulizer solution as needed     . gabapentin (NEURONTIN) 600 MG tablet Take 1 tablet by mouth 3 times daily..     . tiZANidine (ZANAFLEX) 2 MG tablet Take 4 mg by mouth 2 times daily      . ibuprofen (ADVIL;MOTRIN) 800 MG tablet Take 1 tablet by mouth 3 times daily as needed     . famotidine (PEPCID) 20 MG tablet Take 1 tablet by mouth 3 times daily     . cetirizine-psuedoephedrine (ZYRTEC-D) 5-120 MG per extended release tablet as needed       No current facility-administered medications for this visit.        Allergies   Allergen Reactions   . Abilify [Aripiprazole] Other (See Comments)     Seizures   . Lamictal [Lamotrigine] Hives     REVIEW OF SYSTEMS    Constitutional: [] Fever [] Sweat [] Chills []  Recent Injury [x]  Denies all unless marked  HEENT:[x] Headache  []  Head Injury/Hearing Loss  []  Sore Throat  []  Ear Ache/Dizziness  [x]  Denies all unless marked  Spine:  []  Neck pain  []  Back pain  []  Sciaticia  [x]  Denies all unless marked  Cardiovascular:[] Heart Disease [] Chest Pain []  Palpitations  [x]  Denies all unless marked  Pulmonary: [] Shortness of Breath [] Cough   [x]   Denies all unless marke  Gastrointestinal: [] Nausea  [] Vomiting  [] Abdominal Pain  [] Constipation  [] Diarrhea  [] Dark Bloody Stools  [x]  Denies all unless marked  Psychiatric/Behavioral:[]  Depression []  Anxiety [x]  Denies all unless marked  Genitourinary:   []  Frequency  []  Urgency  []  Incontinence []  Pain with Urination  [x]  Denies all unless marked  Extremities: [] Pain  [] Swelling  [x]  Denies all unless marked  Musculoskeletal: [x]  Muscle Pain  [x]  Joint Pain  [x]  Arthritis []  Muscle Cramps []  Muscle Twitches  [x]  Denies all unless marked  Sleep: []  Insomnia []  Snoring []  Restless Legs []  Sleep Apnea  []  Daytime Sleepiness  [x]  Denies all unless marked  Skin:[]  Rash []  Skin Discoloration [x]  Denies all unless marked   Neurological: [] Visual Disturbance/Memory Loss []  Loss of Balance []  Slurred Speech/Weakness []  Seizures  []  Vertigo/Dizziness [x]  Denies all unless marked    ROS reviewed, agree with above.       PHYSICAL EXAM  Constitutional -   BP 118/70   Pulse 72   Ht 5\' 5"  (1.651 m)   Wt 204 lb 3.2 oz (92.6 kg)   SpO2 96%   BMI 33.98 kg/m   General appearance: No acute distress   EYES -   Conjunctiva normal  Pupillary exam as below, see CN exam in the neurologic exam  ENT-    No scars, masses, or lesions over external nose or ears  Hearing normal bilaterally to finger rub  Cardiovascular -   RRR  No clubbing, cyanosis, or edema   Respiratory-   Good expansion, normal effort without use of accessory muscles  CTA  Musculoskeletal -   No significant wasting of muscles noted  Gait as below, see gait  exam in the neurologic exam  Muscle strength, tone, stability as below.   No bony deformities  Skin -   Warm, dry, and intact to inspection and palpation.    No rash, erythema, or pallor  Psychiatric -   Mood, affect, and behavior appear normal    Memory as below see mental status examination in the neurologic exam      NEUROLOGICAL EXAM    Mental status   [x] Awake, alert, oriented   [x] Affect attention and  concentration appear appropriate  [x] Recent and remote memory appears unremarkable  [x] Speech normal without dysarthria or aphasia, comprehension and repetition intact.   COMMENTS:    Cranial Nerves [x] No VF deficit to confrontation,  no papilledema on fundoscopic exam.  [x] PERRLA, EOMI, no nystagmus, conjugate eye movements, no ptosis  [x] Face symmetric  [x] Facial sensation intact  [x] Tongue midline no atrophy or fasciculations present  [x] Palate midline, hearing to finger rub normal bilaterally  [x] Shoulder shrug and SCM testing normal bilaterally  COMMENTS:   Motor   [x] 5/5 strength x 4 extremities  [x] Normal bulk and tone  [x] No tremor present  [x] No rigidity or bradykinesia noted  COMMENTS:   Sensory  [x] Sensation intact to light touch, pin prick, vibration, and proprioception BLE  [] Sensation intact to light touch, pin prick, vibration, and proprioception BUE  COMMENTS:   Coordination [x] FTN normal bilaterally   [] HTS normal bilaterally  [] RAM normal bilaterally.   COMMENTS:   Reflexes  [x] Symmetric and non-pathological  [x] Toes down going bilaterally  [x] No clonus present  COMMENTS:   Gait                  [x] Normal steady gait    [] Ataxic    [] Spastic     [] Magnetic     [] Shuffling  COMMENTS:       LABS RECORD AND IMAGING REVIEW (As below and per HPI)    Records reviewed.     ASSESSMENT:    Jay [facility] is a 42 y.o. year old male here for recurrent seizure like episodes.  Video EEG was largely negative, brief non-epileptic events captured. Not currently on treatment, has failed lamictal and keppra in the past. Question if prior drug use may have played a role.  MRI c/w chiari, otherwise negative.  No further events since last seen.     PLAN:  1.  Hold off on AEDs for now given negative workup.  2.  Follow chiari, consider surgery evaluation, but currently not felt symptomatic.    3.  Event precautions discussed.  No driving, heights, swimming, tub baths, open flames, or heavy machinery.      Wallie Char,  DO  Board Certified [facility]

## 2018-04-11 ENCOUNTER — Encounter: Attending: Neurology | Primary: Clinical

## 2018-04-11 NOTE — Telephone Encounter (Addendum)
 1st attempt calling pt to r/s no show. Number not in service. 04-11-18 AC

## 2018-04-12 NOTE — Telephone Encounter (Signed)
 2nd attempt calling pt to r/s no show. Ph number not in service. 04-12-18 AC

## 2018-04-13 NOTE — Telephone Encounter (Signed)
 3rd attempt calling pt to r/s no show. Ph not in service. 04-13-18 AC

## 2019-02-27 ENCOUNTER — Emergency Department (HOSPITAL_COMMUNITY): Payer: Worker's Compensation

## 2019-02-27 ENCOUNTER — Other Ambulatory Visit: Payer: Self-pay

## 2019-02-27 ENCOUNTER — Encounter (HOSPITAL_COMMUNITY): Payer: Self-pay | Admitting: Emergency Medicine

## 2019-02-27 ENCOUNTER — Emergency Department (HOSPITAL_COMMUNITY)
Admission: EM | Admit: 2019-02-27 | Discharge: 2019-02-27 | Disposition: A | Discharge status: Home / Self Care | Payer: Worker's Compensation | Attending: Emergency Medicine | Admitting: Emergency Medicine

## 2019-02-27 DIAGNOSIS — Y929 Unspecified place or not applicable: Secondary | ICD-10-CM | POA: Insufficient documentation

## 2019-02-27 DIAGNOSIS — W298XXA Contact with other powered powered hand tools and household machinery, initial encounter: Secondary | ICD-10-CM | POA: Insufficient documentation

## 2019-02-27 DIAGNOSIS — F17210 Nicotine dependence, cigarettes, uncomplicated: Secondary | ICD-10-CM | POA: Diagnosis not present

## 2019-02-27 DIAGNOSIS — S61309A Unspecified open wound of unspecified finger with damage to nail, initial encounter: Secondary | ICD-10-CM

## 2019-02-27 DIAGNOSIS — S61011A Laceration without foreign body of right thumb without damage to nail, initial encounter: Secondary | ICD-10-CM | POA: Insufficient documentation

## 2019-02-27 DIAGNOSIS — Y9389 Activity, other specified: Secondary | ICD-10-CM | POA: Insufficient documentation

## 2019-02-27 DIAGNOSIS — S6991XA Unspecified injury of right wrist, hand and finger(s), initial encounter: Secondary | ICD-10-CM

## 2019-02-27 DIAGNOSIS — Y990 Civilian activity done for income or pay: Secondary | ICD-10-CM | POA: Diagnosis not present

## 2019-02-27 MED ORDER — LIDOCAINE HCL (PF) 1 % IJ SOLN
10.0000 mL | Freq: Once | INTRAMUSCULAR | Status: DC
  Filled 2019-02-27: qty 30

## 2019-02-27 NOTE — ED Triage Notes (Signed)
Pt reports that he cut his right index finger with pair scissors at work. Right thumb nail is completely off.

## 2019-02-27 NOTE — Discharge Instructions (Signed)
Keep the wound clean and dry.  As we discussed, the Dermabond will remove by itself.  Once it is removed, make sure you are gently cleaning with soap and water.  You can apply bacitracin Neosporin 2 times a day to help with prevent infection.  Return the emergency department for any worsening pain, redness or swelling of the finger, fevers or any other worsening or concerning symptoms.

## 2019-02-27 NOTE — ED Provider Notes (Signed)
[facility] DEPT Provider Note   CSN: [id number] Arrival date & time: 02/27/19  1655    History   Chief Complaint Chief Complaint  Patient presents with  . Finger Injury    HPI Gabriel Montgomery is a 43 y.o. male who presents for evaluation of injury to right thumb that occurred just prior to arrival.  Patient reports that he was at work and was states that he was using a hydraulic saw.  He states that he had a pipe in his hand and went to cut the pipe and states that the saw fell on his finger.  He states that the blade did not actually touch his thumb.  He was wearing gloves at the time.  Patient states that it fell on his right thumb and caused his right nail to come off.  He states that his tetanus has been within the last year.  He denies any numbness/weakness.  He is not currently on blood thinners.  He states that he has pain to the distal aspect of the right thumb but no difficulty moving.     The history is provided by the patient.    History reviewed. No pertinent past medical history.  There are no active problems to display for this patient.   History reviewed. No pertinent surgical history.      Home Medications    Prior to Admission medications   Not on File    Family History No family history on file.  Social History Social History   Tobacco Use  . Smoking status: Current Every Day Smoker    Types: Cigarettes  . Smokeless tobacco: Never Used  Substance Use Topics  . Alcohol use: Not on file  . Drug use: Not on file     Allergies   Patient has no known allergies.   Review of Systems Review of Systems  Skin: Positive for wound.  Neurological: Negative for weakness and numbness.  All other systems reviewed and are negative.    Physical Exam Updated Vital Signs BP 128/86   Pulse 80   Temp 98.3 F (36.8 C) (Oral)   Resp 16   SpO2 99%   Physical Exam Vitals signs and nursing note reviewed.   Constitutional:      Appearance: He is well-developed.  HENT:     Head: Normocephalic and atraumatic.  Eyes:     General: No scleral icterus.       Right eye: No discharge.        Left eye: No discharge.     Conjunctiva/sclera: Conjunctivae normal.  Cardiovascular:     Pulses:          Radial pulses are 2+ on the right side and 2+ on the left side.  Pulmonary:     Effort: Pulmonary effort is normal.  Musculoskeletal:     Comments: Tenderness palpation noted right thumb.  No deformity or crepitus noted.  Flexion/extension intact without any difficulty.  Flexion/tension of IP joint intact with any difficulty.  Nail is completely removed.  Nailbed laceration is covered in blood.  Question laceration versus missing piece.   Skin:    General: Skin is warm and dry.     Capillary Refill: Capillary refill takes less than 2 seconds.     Comments: Good distal cap refill.  RUE is not dusky in appearance or cool to touch.  Abrasion noted to the volar aspect of thumb.  No deep laceration.  Neurological:  Mental Status: He is alert.  Psychiatric:        Speech: Speech normal.        Behavior: Behavior normal.      ED Treatments / Results  Labs (all labs ordered are listed, but only abnormal results are displayed) Labs Reviewed - No data to display  EKG None  Radiology Dg Finger Thumb Right  Result Date: 02/27/2019 CLINICAL DATA:  Thumb laceration. EXAM: RIGHT THUMB 2+V COMPARISON:  None. FINDINGS: No acute fracture or dislocation. Joint spaces are preserved. Bone mineralization is normal. Soft tissue irregularity involving the thumb nail bed. No radiopaque foreign body identified. IMPRESSION: 1. Soft tissue irregularity involving the thumb nail bed. No acute osseous abnormality or radiopaque foreign body. Electronically Signed   By: Obie DredgeWilliam T Derry M.D.   On: 02/27/2019 19:17    Procedures Procedures (including critical care time)  Medications Ordered in ED Medications - No data  to display   Initial Impression / Assessment and Plan / ED Course  I have reviewed the triage vital signs and the nursing notes.  Pertinent labs & imaging results that were available during my care of the patient were reviewed by me and considered in my medical decision making (see chart for details).        43 year old male who presents for evaluation of right thumb injury.  Reports that a hydraulic saw fell on his thumb, causing the nail to be removed.  Tetanus is up-to-date.  No blood thinners.  Denies any numbness/weakness. Patient is afebrile, non-toxic appearing, sitting comfortably on examination table. Vital signs reviewed and stable. Patient is neurovascularly intact.  On exam, the right nail is completely removed.  Right nailbed is covered in blood.  Question laceration versus injury.  Will plan for x-ray given tenderness.  X-ray reviewed.  No acute bony abnormality.  No evidence of foreign body.  Wound thoroughly and extensively irrigated with saline.  On reevaluation of the wound once it is clean, it shows that he took a proximally 0.5 cm area on the nailbed on the lateral aspect.  No laceration.  There is nothing to repair.  Evaluation of this wound shows does not extend deeper.  No evidence of deep laceration.  I did discuss with patient regarding treatment options.  I offered to anchor the nail back in place patient declined.  I discussed that this may help with regrowth of nail but patient does not want the nail back in.  Additionally, the area is not amenable to stitches as there is a top layer removed with no area that would be able to be sutured back in place.  Discussed options of Dermabond and nailbed.  Patient is agreeable.   Dermabond applied to nail bed.  Wound dressed.  Encouraged at home supportive care measures. At this time, patient exhibits no emergent life-threatening condition that require further evaluation in ED or admission. Patient had ample opportunity for  questions and discussion. All patient's questions were answered with full understanding. Strict return precautions discussed. Patient expresses understanding and agreement to plan.   Portions of this note were generated with Scientist, clinical (histocompatibility and immunogenetics)Dragon dictation software. Dictation errors may occur despite best attempts at proofreading.   Final Clinical Impressions(s) / ED Diagnoses   Final diagnoses:  Injury of nail bed of finger of right hand, initial encounter  Nail avulsion, finger, initial encounter    ED Discharge Orders    None       Rosana HoesLayden, Raivyn Kabler A, PA-C 02/28/19 0017    Lynelle DoctorKnapp,  Cletis AthensJon, MD 03/01/19 2024

## 2019-02-27 NOTE — ED Notes (Signed)
Patient called out reporting he was bleeding from his laceration. When at bedside, patient's thumb is oozing a little blood but not bleeding uncontrollably. Applied gauze to the wound and secured with tape. Patient tolerated it well.

## 2019-12-27 IMAGING — CR RIGHT THUMB 2+V
3 series · 3 of 3 positions shown · non-contrast
Comparison: None.

CLINICAL DATA: Thumb laceration.

EXAM:
RIGHT THUMB 2+V

[x finger pa right]
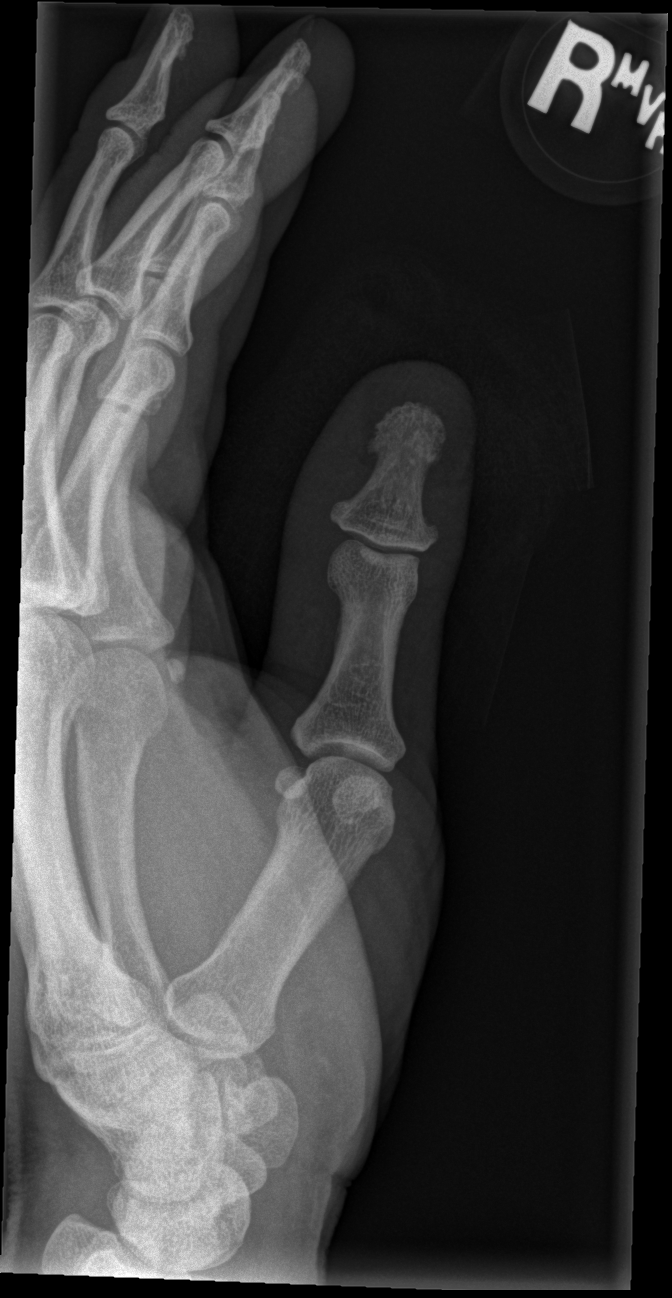

[x finger obl right]
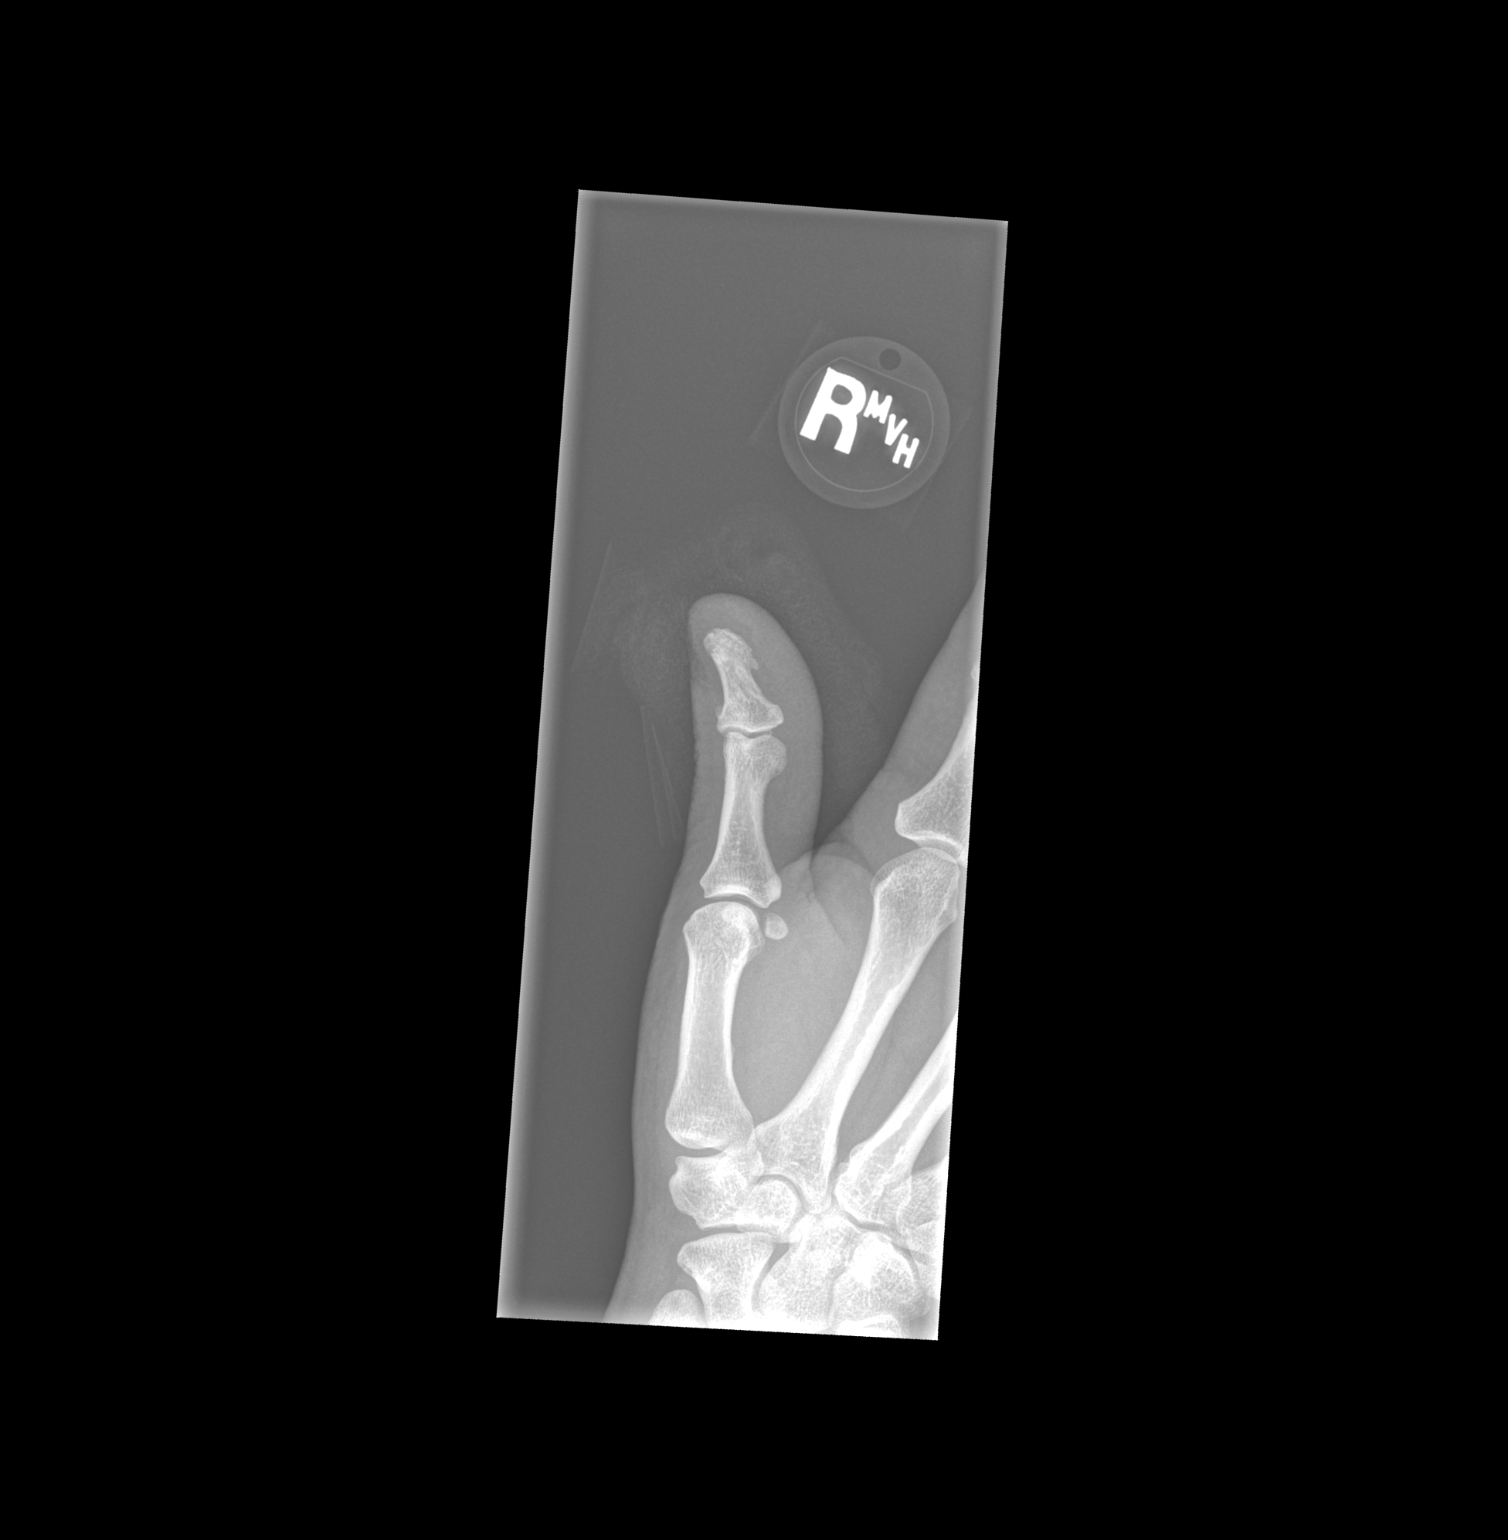

[x finger lat right]
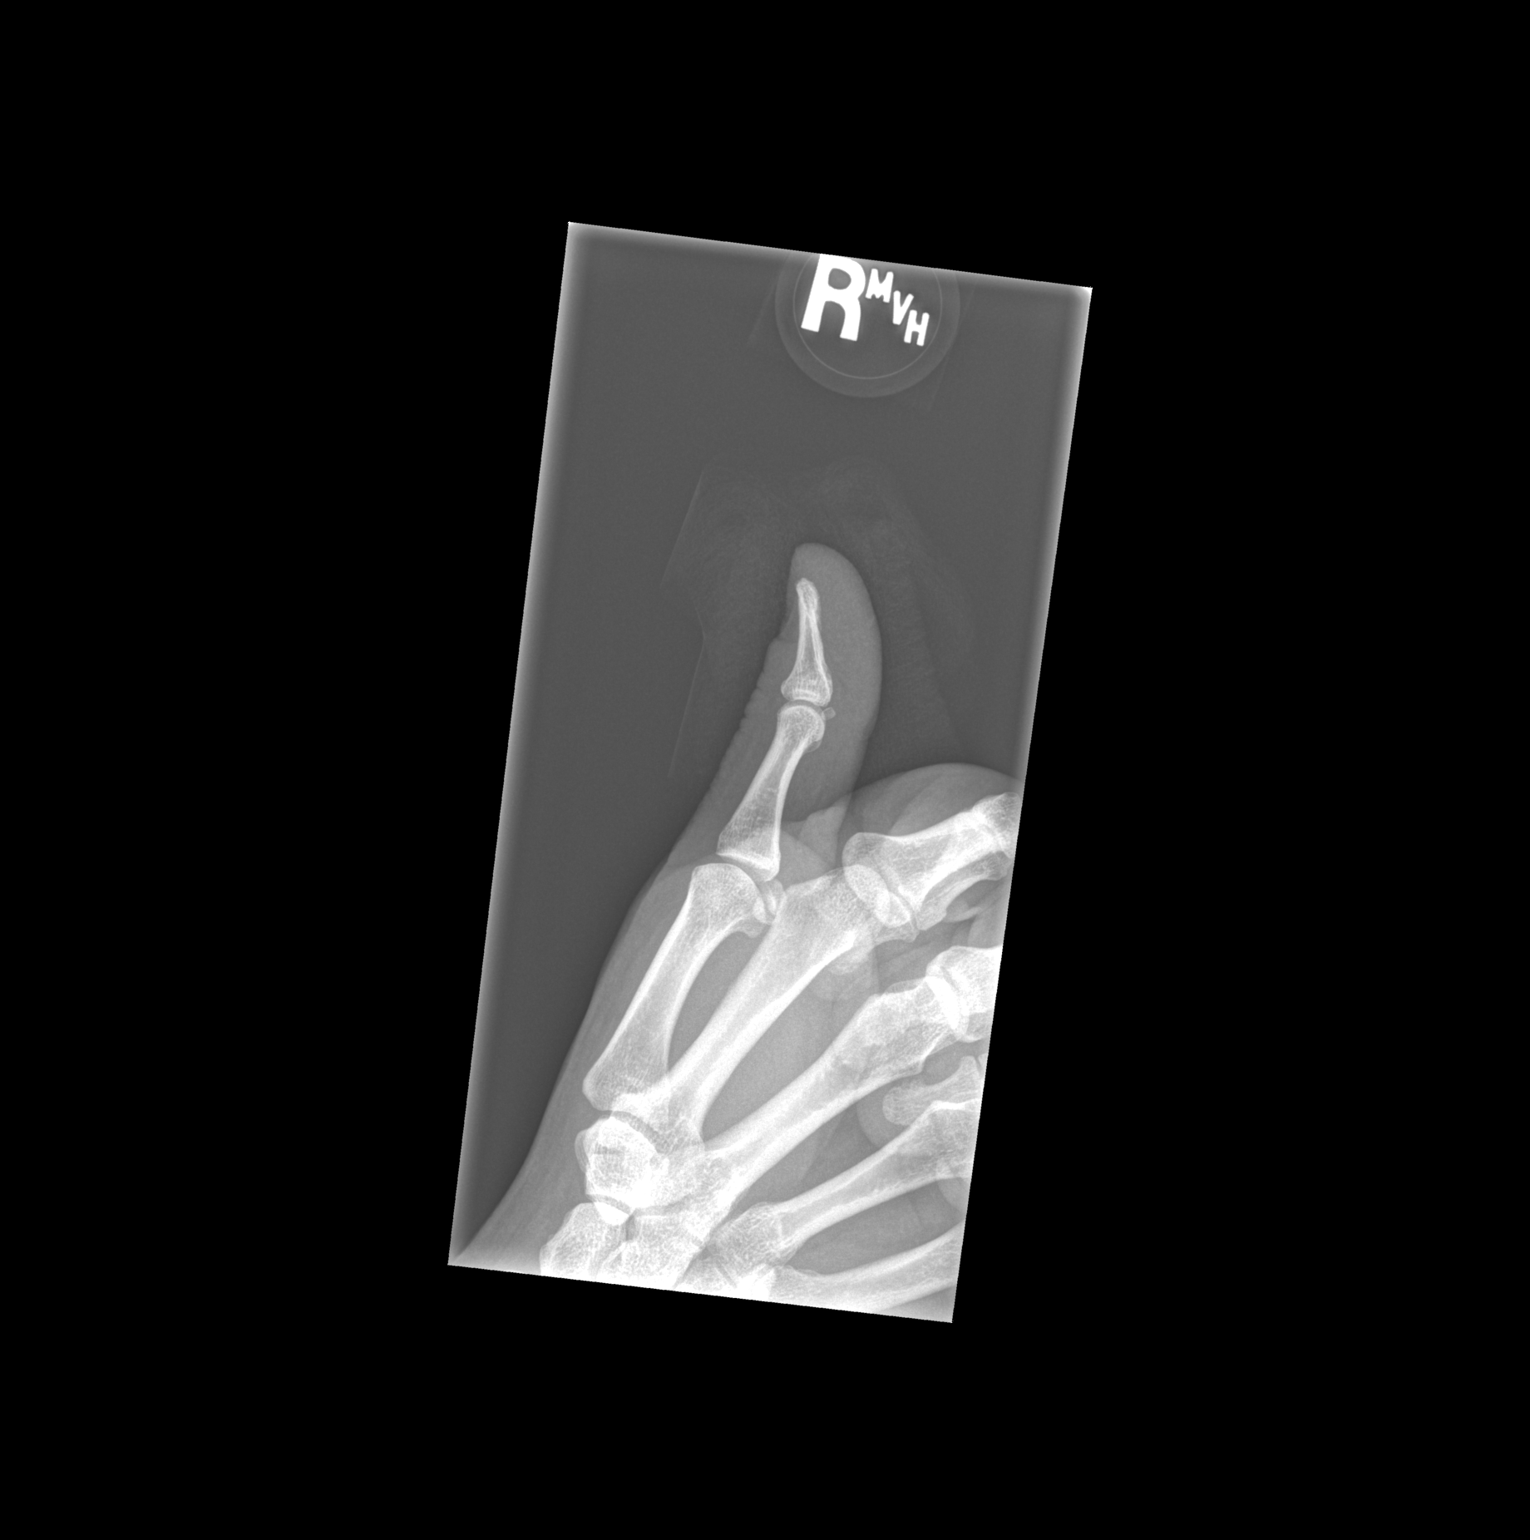

[3 of 3 positions shown; findings below may reference images not displayed]

FINDINGS: No acute fracture or dislocation. Joint spaces are preserved. Bone
mineralization is normal. Soft tissue irregularity involving the
thumb nail bed. No radiopaque foreign body identified.
IMPRESSION: 1. Soft tissue irregularity involving the thumb nail bed. No acute
osseous abnormality or radiopaque foreign body.

## 2021-05-14 DIAGNOSIS — S0592XA Unspecified injury of left eye and orbit, initial encounter: Secondary | ICD-10-CM | POA: Diagnosis present

## 2021-05-14 DIAGNOSIS — Y990 Civilian activity done for income or pay: Secondary | ICD-10-CM | POA: Insufficient documentation

## 2021-05-14 DIAGNOSIS — F17210 Nicotine dependence, cigarettes, uncomplicated: Secondary | ICD-10-CM | POA: Insufficient documentation

## 2021-05-14 DIAGNOSIS — S0502XA Injury of conjunctiva and corneal abrasion without foreign body, left eye, initial encounter: Secondary | ICD-10-CM | POA: Insufficient documentation

## 2021-05-14 DIAGNOSIS — Z91040 Latex allergy status: Secondary | ICD-10-CM | POA: Diagnosis not present

## 2021-05-14 DIAGNOSIS — W228XXA Striking against or struck by other objects, initial encounter: Secondary | ICD-10-CM | POA: Diagnosis not present

## 2021-05-15 ENCOUNTER — Encounter (HOSPITAL_COMMUNITY): Payer: Self-pay

## 2021-05-15 ENCOUNTER — Other Ambulatory Visit: Payer: Self-pay

## 2021-05-15 ENCOUNTER — Emergency Department (HOSPITAL_COMMUNITY)
Admission: EM | Admit: 2021-05-15 | Discharge: 2021-05-15 | Disposition: A | Discharge status: Home / Self Care | Payer: Medicaid Other | Attending: Emergency Medicine | Admitting: Emergency Medicine

## 2021-05-15 DIAGNOSIS — S0502XA Injury of conjunctiva and corneal abrasion without foreign body, left eye, initial encounter: Secondary | ICD-10-CM

## 2021-05-15 MED ORDER — TETRACAINE HCL 0.5 % OP SOLN
2.0000 [drp] | Freq: Once | OPHTHALMIC | Status: AC
  Administered 2021-05-15: 2 [drp] via OPHTHALMIC
  Filled 2021-05-15: qty 4

## 2021-05-15 MED ORDER — ERYTHROMYCIN 5 MG/GM OP OINT
TOPICAL_OINTMENT | Freq: Once | OPHTHALMIC | Status: AC
  Filled 2021-05-15: qty 3.5

## 2021-05-15 MED ORDER — FLUORESCEIN SODIUM 1 MG OP STRP
1.0000 | ORAL_STRIP | Freq: Once | OPHTHALMIC | Status: AC
  Administered 2021-05-15: 1 via OPHTHALMIC
  Filled 2021-05-15: qty 1

## 2021-05-15 NOTE — ED Provider Notes (Signed)
[facility]-EMERGENCY DEPT Provider Note   CSN: [id number] Arrival date & time: 05/14/21  2353     History Chief Complaint  Patient presents with   Eye Pain    Gabriel Montgomery is a 45 y.o. male without significant past medical history who presents to the emergency department for evaluation of left eye irritation after injury earlier this evening.  Patient states that he was accidentally hit in the eye with a fishing rod tip which resulted in irritation and/pain of the eye.  He states his vision is a little bit blurry.  No alleviating or aggravating factors.  He states he also bumped his head at work resulting in an abrasion to the left eyebrow area.  He denies loss of consciousness.  He denies fever, chills, vomiting, seizure activity, syncope, or headache.  Patient is not a contact lens wearer.  His last tetanus was in June 2022.  HPI     History reviewed. No pertinent past medical history.  There are no problems to display for this patient.   History reviewed. No pertinent surgical history.     No family history on file.  Social History   Tobacco Use   Smoking status: Every Day    Types: Cigarettes   Smokeless tobacco: Never  Vaping Use   Vaping Use: Never used    Home Medications Prior to Admission medications   Not on File    Allergies    Cephalexin, Pollen extract, Short ragweed pollen ext, and Latex  Review of Systems   Review of Systems  Constitutional:  Negative for chills and fever.  Eyes:  Positive for pain, redness and visual disturbance.  Respiratory:  Negative for shortness of breath.   Cardiovascular:  Negative for chest pain.  Musculoskeletal:  Negative for back pain and neck pain.  Neurological:  Negative for syncope, weakness, light-headedness, numbness and headaches.  All other systems reviewed and are negative.  Physical Exam Updated Vital Signs BP 113/88   Pulse 72   Temp 98.4 F (36.9 C) (Oral)   Resp 20    SpO2 94%   Physical Exam Vitals and nursing note reviewed.  Constitutional:      General: He is not in acute distress.    Appearance: He is well-developed. He is not toxic-appearing.  HENT:     Head: Normocephalic. No raccoon eyes or Battle's sign.     Comments: Small abrasion to left lateral eyebrow.  No active bleeding.  No significant periorbital edema/erythema/ecchymosis.    Right Ear: No hemotympanum.     Left Ear: No hemotympanum.  Eyes:     General: Lids are everted, no foreign bodies appreciated. Vision grossly intact. No visual field deficit.    Extraocular Movements: Extraocular movements intact.     Conjunctiva/sclera:     Right eye: Right conjunctiva is not injected. No hemorrhage.    Left eye: Left conjunctiva is injected. No hemorrhage.    Pupils: Pupils are equal, round, and reactive to light.     Comments: Examined with fluorescein stain, patient has a 4 to 5 mm  homogenous area of uptake to the central aspect of the left cornea consistent with corneal abrasion.  Negative Seidel sign. No proptosis.  Visual acuity: Bilateral Near: 20/40 R Near: 20/40 L Near: 20/70  Cardiovascular:     Rate and Rhythm: Normal rate and regular rhythm.  Pulmonary:     Effort: Pulmonary effort is normal. No respiratory distress.     Breath sounds:  Normal breath sounds. No wheezing, rhonchi or rales.  Abdominal:     General: There is no distension.     Palpations: Abdomen is soft.     Tenderness: There is no abdominal tenderness.  Musculoskeletal:     Cervical back: Normal range of motion and neck supple. No tenderness.  Skin:    General: Skin is warm and dry.     Findings: No rash.  Neurological:     Mental Status: He is alert.     Comments: Clear speech.   Psychiatric:        Behavior: Behavior normal.        Thought Content: Thought content normal.    ED Results / Procedures / Treatments   Labs (all labs ordered are listed, but only abnormal results are displayed) Labs  Reviewed - No data to display  EKG None  Radiology No results found.  Procedures Procedures   Medications Ordered in ED Medications  erythromycin ophthalmic ointment (has no administration in time range)  tetracaine (PONTOCAINE) 0.5 % ophthalmic solution 2 drop (2 drops Left Eye Given by Other 05/15/21 0428)  fluorescein ophthalmic strip 1 strip (1 strip Left Eye Given 05/15/21 0429)    ED Course  I have reviewed the triage vital signs and the nursing notes.  Pertinent labs & imaging results that were available during my care of the patient were reviewed by me and considered in my medical decision making (see chart for details).    MDM Rules/Calculators/A&P                           Patient presents to the ED with complaints of left eye injury that occurred shortly PTA. Nontoxic, vitals without significant abnormality.    Additional history obtained:  Additional history obtained from chart review & nursing note review.   ED Course:  Patient without periorbital edema/ecchymosis/tenderness, extraocular movements are intact, do not suspect significant orbital fracture.  Pupils are equal round and reactive to light.  Fluorescein stain performed, negative Seidel sign, do not suspect globe rupture at this time, patient does have a uniform area of uptake consistent with corneal abrasion.  Abrasion is approximately 4 to 5 mm occurring centrally.  Patient has mildly worse visual acuity in the left eye compared to the right eye, however vision is grossly intact.  He is not a contact lens wearer.  His tetanus is up-to-date.  Will start on erythromycin ointment and have him follow-up closely with ophthalmology. I discussed treatment plan, need for follow-up, and return precautions with the patient. Provided opportunity for questions, patient confirmed understanding and is in agreement with plan.   Findings and plan of care discussed with supervising physician Dr. Preston Fleeting who is in agreement.    Portions of this note were generated with Scientist, clinical (histocompatibility and immunogenetics). Dictation errors may occur despite best attempts at proofreading.  Final Clinical Impression(s) / ED Diagnoses Final diagnoses:  Abrasion of left cornea, initial encounter    Rx / DC Orders ED Discharge Orders     None        Cherly Anderson, PA-C 05/15/21 0551    Dione Booze, MD 05/15/21 [phone]

## 2021-05-15 NOTE — Discharge Instructions (Signed)
You were seen in the emergency department today after an injury to your eye.  You have a corneal abrasion, please see attached handout.  We are starting you on antibiotic ointment to help prevent infection.  Please apply half an inch ribbon of ointment to your left eye 4 times per day for the next 1 week.  Take ibuprofen and/or Tylenol per over-the-counter dosing to help with discomfort.  We would like you to follow-up with an eye specialist, please call Dr. Margaretmary Eddy office today to schedule an appointment for follow-up with soon as possible.  Return to the emergency department for new or worsening symptoms including but not limited to new or worsening pain, fever, loss of vision, double vision, worsening blurry vision, vomiting, pain/swelling/redness around the eye, trouble moving your eye, or any other concerns.

## 2021-05-15 NOTE — ED Triage Notes (Signed)
Pt reports left eye injury after being poked with the tip of a fishing pole.

## 2021-05-15 NOTE — ED Provider Notes (Signed)
Emergency Medicine Provider Triage Evaluation Note  Gabriel Montgomery , a 45 y.o. male  was evaluated in triage.  Pt complains of left eye injury. Poked in the eye with a fishing rod tip. Having discomfort, FB sensation, and mild blurry vision. Not a contact lens wearer.   Review of Systems  Positive: Blurry vision, eye pain Negative: LOC, vomiting.   Physical Exam  BP 124/87   Pulse 79   Temp 98.3 F (36.8 C) (Oral)   Resp (!) 22   SpO2 97%  Gen:   Awake, no distress   Resp:  Normal effort  MSK:   Moves extremities without difficulty  Other:  Eyes: PERRL. EOMI. Vision grossly intact- can tell me how many fingers I am holding up. L eye with conjunctival injection.   Medical Decision Making  Medically screening exam initiated at 12:17 AM.  Appropriate orders placed.  Polo Riley was informed that the remainder of the evaluation will be completed by another provider, this initial triage assessment does not replace that evaluation, and the importance of remaining in the ED until their evaluation is complete.  L eye injury.  Will need further assessment with fluorescein.    Desmond Lope 05/15/21 0018    Dione Booze, MD 05/15/21 [phone]

## 2021-06-08 ENCOUNTER — Other Ambulatory Visit: Payer: Self-pay

## 2021-06-08 ENCOUNTER — Encounter (HOSPITAL_COMMUNITY): Payer: Self-pay

## 2021-06-08 ENCOUNTER — Emergency Department (HOSPITAL_COMMUNITY)
Admission: EM | Admit: 2021-06-08 | Discharge: 2021-06-08 | Disposition: A | Discharge status: Home / Self Care | Payer: Medicaid Other | Attending: Emergency Medicine | Admitting: Emergency Medicine

## 2021-06-08 DIAGNOSIS — Z20822 Contact with and (suspected) exposure to covid-19: Secondary | ICD-10-CM | POA: Insufficient documentation

## 2021-06-08 DIAGNOSIS — Y900 Blood alcohol level of less than 20 mg/100 ml: Secondary | ICD-10-CM | POA: Diagnosis not present

## 2021-06-08 DIAGNOSIS — F17210 Nicotine dependence, cigarettes, uncomplicated: Secondary | ICD-10-CM | POA: Diagnosis not present

## 2021-06-08 DIAGNOSIS — R45850 Homicidal ideations: Secondary | ICD-10-CM | POA: Diagnosis not present

## 2021-06-08 DIAGNOSIS — Z91040 Latex allergy status: Secondary | ICD-10-CM | POA: Diagnosis not present

## 2021-06-08 DIAGNOSIS — Z046 Encounter for general psychiatric examination, requested by authority: Secondary | ICD-10-CM | POA: Insufficient documentation

## 2021-06-08 DIAGNOSIS — J45909 Unspecified asthma, uncomplicated: Secondary | ICD-10-CM | POA: Insufficient documentation

## 2021-06-08 HISTORY — DX: Unspecified convulsions: R56.9

## 2021-06-08 HISTORY — DX: Unspecified asthma, uncomplicated: J45.909

## 2021-06-08 LAB — CBC WITH DIFFERENTIAL/PLATELET
Abs Immature Granulocytes: 0.03 10*3/uL (ref 0.00–0.07)
Basophils Absolute: 0.1 10*3/uL (ref 0.0–0.1)
Basophils Relative: 1 %
Eosinophils Absolute: 0.2 10*3/uL (ref 0.0–0.5)
Eosinophils Relative: 3 %
HCT: 50.3 % (ref 39.0–52.0)
Hemoglobin: 16.9 g/dL (ref 13.0–17.0)
Immature Granulocytes: 0 %
Lymphocytes Relative: 28 %
Lymphs Abs: 2.3 10*3/uL (ref 0.7–4.0)
MCH: 31.1 pg (ref 26.0–34.0)
MCHC: 33.6 g/dL (ref 30.0–36.0)
MCV: 92.6 fL (ref 80.0–100.0)
Monocytes Absolute: 0.4 10*3/uL (ref 0.1–1.0)
Monocytes Relative: 5 %
Neutro Abs: 5.2 10*3/uL (ref 1.7–7.7)
Neutrophils Relative %: 63 %
Platelets: 238 10*3/uL (ref 150–400)
RBC: 5.43 MIL/uL (ref 4.22–5.81)
RDW: 13.1 % (ref 11.5–15.5)
WBC: 8.2 10*3/uL (ref 4.0–10.5)
nRBC: 0 % (ref 0.0–0.2)

## 2021-06-08 LAB — SALICYLATE LEVEL: Salicylate Lvl: <7 mg/dL — ABNORMAL LOW (ref 7.0–30.0)

## 2021-06-08 LAB — COMPREHENSIVE METABOLIC PANEL
ALT: 30 U/L (ref 0–44)
AST: 23 U/L (ref 15–41)
Albumin: 4.5 g/dL (ref 3.5–5.0)
Alkaline Phosphatase: 63 U/L (ref 38–126)
Anion gap: 9 (ref 5–15)
BUN: 20 mg/dL (ref 6–20)
CO2: 22 mmol/L (ref 22–32)
Calcium: 9.2 mg/dL (ref 8.9–10.3)
Chloride: 106 mmol/L (ref 98–111)
Creatinine, Ser: 0.91 mg/dL (ref 0.61–1.24)
GFR, Estimated: >60 mL/min (ref >60)
Glucose, Bld: 102 mg/dL — ABNORMAL HIGH (ref 70–99)
Potassium: 4 mmol/L (ref 3.5–5.1)
Sodium: 137 mmol/L (ref 135–145)
Total Bilirubin: 0.8 mg/dL (ref 0.3–1.2)
Total Protein: 7.6 g/dL (ref 6.5–8.1)

## 2021-06-08 LAB — RESP PANEL BY RT-PCR (FLU A&B, COVID) ARPGX2
Influenza A by PCR: NEGATIVE
Influenza B by PCR: NEGATIVE
SARS Coronavirus 2 by RT PCR: NEGATIVE

## 2021-06-08 LAB — ETHANOL: Alcohol, Ethyl (B): <10 mg/dL (ref <10)

## 2021-06-08 LAB — ACETAMINOPHEN LEVEL: Acetaminophen (Tylenol), Serum: <10 ug/mL — ABNORMAL LOW (ref 10–30)

## 2021-06-08 NOTE — ED Notes (Signed)
Pt would like to see about getting his sutures out of his forehead today. He also is upset because he is worried that he will miss a court date tomorrow at 9 am in [place].

## 2021-06-08 NOTE — ED Notes (Signed)
Pt's belongings are placed in one white bag kept under the ice machine in triage.

## 2021-06-08 NOTE — ED Provider Notes (Signed)
[facility]-EMERGENCY DEPT Provider Note   CSN: [id number] Arrival date & time: 06/08/21  1606     History Chief Complaint  Patient presents with   Medical Clearance   Homicidal    Gabriel Montgomery is a 45 y.o. male presenting for medical clearance and according homicidal thoughts.  Patient states his wife told him to come in to have a mental evaluation.  He reports he is feeling homicidal towards people she is hanging out with, but denies suicidal thoughts or auditory visual hallucinations.  He states he is not verbally or physically aggressive towards her, in fact it is the other way around.  He states he takes no medications daily.  He reports a history of seizures, but is not on any medications for this.  HPI     Past Medical History:  Diagnosis Date   Asthma    Seizures (HCC)     There are no problems to display for this patient.   Past Surgical History:  Procedure Laterality Date   WRIST FRACTURE SURGERY         History reviewed. No pertinent family history.  Social History   Tobacco Use   Smoking status: Every Day    Packs/day: 1.00    Types: Cigarettes   Smokeless tobacco: Never  Vaping Use   Vaping Use: Never used  Substance Use Topics   Alcohol use: Never   Drug use: Yes    Types: Methamphetamines    Home Medications Prior to Admission medications   Not on File    Allergies    Cephalexin, Pollen extract, Short ragweed pollen ext, and Latex  Review of Systems   Review of Systems  Psychiatric/Behavioral:  Positive for behavioral problems.   All other systems reviewed and are negative.  Physical Exam Updated Vital Signs BP 115/77 (BP Location: Left Arm)   Pulse (!) 51   Temp 97.9 F (36.6 C) (Oral)   Resp 16   Ht 5\' 6"  (1.676 m)   Wt 89.8 kg   SpO2 96%   BMI 31.96 kg/m   Physical Exam Vitals and nursing note reviewed.  Constitutional:      General: He is not in acute distress.    Appearance:  Normal appearance.  HENT:     Head: Normocephalic and atraumatic.  Eyes:     Conjunctiva/sclera: Conjunctivae normal.     Pupils: Pupils are equal, round, and reactive to light.  Cardiovascular:     Rate and Rhythm: Normal rate and regular rhythm.     Pulses: Normal pulses.  Pulmonary:     Effort: Pulmonary effort is normal. No respiratory distress.     Breath sounds: Normal breath sounds. No wheezing.     Comments: Speaking in full sentences.  Clear lung sounds in all fields. Abdominal:     General: There is no distension.     Palpations: Abdomen is soft.     Tenderness: There is no abdominal tenderness.  Musculoskeletal:        General: Normal range of motion.     Cervical back: Normal range of motion and neck supple.  Skin:    General: Skin is warm and dry.     Capillary Refill: Capillary refill takes less than 2 seconds.  Neurological:     Mental Status: He is alert and oriented to person, place, and time.  Psychiatric:        Mood and Affect: Mood and affect normal.  Speech: Speech normal.        Behavior: Behavior normal.        Thought Content: Thought content includes homicidal ideation.     Comments: Reports homicidal thoughts    ED Results / Procedures / Treatments   Labs (all labs ordered are listed, but only abnormal results are displayed) Labs Reviewed  COMPREHENSIVE METABOLIC PANEL - Abnormal; Notable for the following components:      Result Value   Glucose, Bld 102 (*)    All other components within normal limits  SALICYLATE LEVEL - Abnormal; Notable for the following components:   Salicylate Lvl <7.0 (*)    All other components within normal limits  ACETAMINOPHEN LEVEL - Abnormal; Notable for the following components:   Acetaminophen (Tylenol), Serum <10 (*)    All other components within normal limits  RESP PANEL BY RT-PCR (FLU A&B, COVID) ARPGX2  CBC WITH DIFFERENTIAL/PLATELET  ETHANOL  RAPID URINE DRUG SCREEN, HOSP PERFORMED     EKG None  Radiology No results found.  Procedures Procedures   Medications Ordered in ED Medications - No data to display  ED Course  I have reviewed the triage vital signs and the nursing notes.  Pertinent labs & imaging results that were available during my care of the patient were reviewed by me and considered in my medical decision making (see chart for details).    MDM Rules/Calculators/A&P                           Patient presenting for evaluation of med clearance.  On exam, patient appears nontoxic.  He does not have an acute medical condition that is obviously apparent on initial evaluation.  He is denying blood work.  He is requesting to leave.  As he is reporting homicidal thoughts, will submit IVC paperwork.  IVC paperwork was rejected by the magistrate, who feels this is more please problem than a psych problem.  Discussed with patient.  Security was called, they will escort the patient out, and did not report or require any further steps from EDP.  Discussed plan with patient, who is agreeable.  Final Clinical Impression(s) / ED Diagnoses Final diagnoses:  None    Rx / DC Orders ED Discharge Orders     None        Alveria Apley, PA-C 06/08/21 2031    Ernie Avena, MD 06/08/21 2038
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
## 2021-06-08 NOTE — BH Assessment (Signed)
Clinician attempted to complete pt's MH Assessment but there were no providers attached to pt's case in which to contact. TTS to attempt at a later time. 

## 2021-06-08 NOTE — ED Provider Notes (Signed)
Emergency Medicine Provider Triage Evaluation Note  Gabriel Montgomery , a 45 y.o. male  was evaluated in triage.  Pt complains of needing psychiatric evaluation.  He states his wife wanted him to come in to be evaluated.  He denies SI, but reports HI towards "the people his wife is hanging out with."  He does not feel he needs to be evaluated.  He has no complaints.  He is very upset that his wife has taken his dog in his car.  Review of Systems  Positive: HI Negative: SI  Physical Exam  BP 115/77 (BP Location: Left Arm)   Pulse (!) 51   Temp 97.9 F (36.6 C) (Oral)   Resp 16   Ht 5\' 6"  (1.676 m)   Wt 89.8 kg   SpO2 96%   BMI 31.96 kg/m  Gen:   Awake, no distress   Resp:  Normal effort  MSK:   Moves extremities without difficulty  Other:  no ttp of the abd  Medical Decision Making  Medically screening exam initiated at 5:14 PM.  Appropriate orders placed.  was informed that the remainder of the evaluation will be completed by another provider, this initial triage assessment does not replace that evaluation, and the importance of remaining in the ED until their evaluation is complete.  Clearance labs   Polo Riley, PA-C 06/08/21 1736    08/08/21, MD 06/08/21 2016
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
## 2021-06-08 NOTE — Discharge Instructions (Signed)
Intensive Outpatient Programs   [facility]                                  [facility] [address]                                                       [address] [place],  [place]                                                           [place], [place] [phone]                                                              [phone]   [facility]                [facility]         (Inpatient and outpatient)                                           [phone] (Suboxone and Methadone) [address]                                                                                                                 [phone]                                                                                                     [facility]                                               [facility] - Intensive Outpatient [address]                                                            [address] [place], [place] [address]                                                 [place], [place]  [phone]                                                              [phone]   [facility] (Outpatient, Inpatient, Chemical       [facility] (Groups and [facility]idental) (insurance only) [phone]                                              [place], [place]                                                                                                             [phone]                                                    [facility]                                       [facility] (for caregivers and family) [address]                                                    [address] [place], [place]                                                          [place], [place] [phone]                                                               [phone]   Residential Treatment Programs   [facility]            [facility](2 years) Residential: 90 days)                 [facility] [address]                                                            [address] [place], [place]                                                    [place], [place] [phone]                                                              [phone] or [phone]   [facility]                                              [facility] [address]                                                               [address] [place], [place]                                                          [place], [place] [phone]                                                              [phone]   [facility]                                [facility] ([facility] [address]                                                           [address] [place], [place] [address]                                                   [place], [place] [phone]                                                              [phone] Admissions: 8am-3pm M-F   [facility]: Residential Program (90 Days)                     [facility]: [facility]  [place], [place]                                                    [place],   [phone] or [phone]                                             (Walk in Hours over the weekend or by referral)     [facility]: [facility]:[phone] (for crisis response 24 hours a day)     Last Modified by Smith, Gabriel Montgomery, LCSWA at 05/24/21 1447    
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
## 2022-08-08 ENCOUNTER — Emergency Department (HOSPITAL_BASED_OUTPATIENT_CLINIC_OR_DEPARTMENT_OTHER)
Admission: EM | Admit: 2022-08-08 | Discharge: 2022-08-08 | Disposition: A | Discharge status: Home / Self Care | Payer: Medicaid Other | Attending: Emergency Medicine | Admitting: Emergency Medicine

## 2022-08-08 ENCOUNTER — Other Ambulatory Visit: Payer: Self-pay

## 2022-08-08 DIAGNOSIS — Z91040 Latex allergy status: Secondary | ICD-10-CM | POA: Insufficient documentation

## 2022-08-08 DIAGNOSIS — Z7721 Contact with and (suspected) exposure to potentially hazardous body fluids: Secondary | ICD-10-CM | POA: Insufficient documentation

## 2022-08-08 DIAGNOSIS — W460XXA Contact with hypodermic needle, initial encounter: Secondary | ICD-10-CM | POA: Insufficient documentation

## 2022-08-08 LAB — HEPATITIS PANEL, ACUTE
HCV Ab: REACTIVE — AB
Hep A IgM: NONREACTIVE
Hep B C IgM: NONREACTIVE
Hepatitis B Surface Ag: NONREACTIVE

## 2022-08-08 LAB — RAPID HIV SCREEN (HIV 1/2 AB+AG)
HIV 1/2 Antibodies: NONREACTIVE
HIV-1 P24 Antigen - HIV24: NONREACTIVE
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
## 2022-08-08 NOTE — ED Provider Notes (Signed)
[facility] EMERGENCY DEPT Provider Note   CSN: [id number] Arrival date & time: 08/08/22  0216     History  Chief Complaint  Patient presents with   Body Fluid Exposure    Gabriel Montgomery is a 46 y.o. male.  The history is provided by the patient.  Body Fluid Exposure Type of exposure:  Needle stick Exposure substance: unknown   Exposure location: left pinky finger. Exposure context: going through garbage. Tetanus immunization status:  Up to date Patient immunocompromised: no   Known source: no   STD concern: no        Home Medications Prior to Admission medications   Not on File      Allergies    Cephalexin, Pollen extract, Short ragweed pollen ext, and Latex    Review of Systems   Review of Systems  Constitutional:  Negative for fever.  HENT:  Negative for facial swelling.   Eyes:  Negative for redness.  Respiratory:  Negative for wheezing and stridor.   All other systems reviewed and are negative.   Physical Exam Updated Vital Signs BP (!) 137/96   Pulse 93   Temp (!) 97.5 F (36.4 C)   Resp 18   Ht 5\' 6"  (1.676 m)   Wt 79.4 kg   SpO2 100%   BMI 28.25 kg/m  Physical Exam Constitutional:      General: He is not in acute distress.    Appearance: Normal appearance. He is well-developed. He is not diaphoretic.  HENT:     Head: Normocephalic and atraumatic.     Nose: Nose normal.  Eyes:     Conjunctiva/sclera: Conjunctivae normal.     Pupils: Pupils are equal, round, and reactive to light.  Cardiovascular:     Rate and Rhythm: Normal rate and regular rhythm.     Pulses: Normal pulses.     Heart sounds: Normal heart sounds.  Pulmonary:     Effort: Pulmonary effort is normal.     Breath sounds: Normal breath sounds. No wheezing or rales.  Abdominal:     General: Bowel sounds are normal.     Palpations: Abdomen is soft.     Tenderness: There is no abdominal tenderness. There is no guarding or rebound.  Musculoskeletal:         General: Normal range of motion.     Cervical back: Normal range of motion and neck supple.  Skin:    General: Skin is warm and dry.     Capillary Refill: Capillary refill takes less than 2 seconds.  Neurological:     General: No focal deficit present.     Mental Status: He is alert and oriented to person, place, and time.     Deep Tendon Reflexes: Reflexes normal.  Psychiatric:        Mood and Affect: Mood normal.        Behavior: Behavior normal.     ED Results / Procedures / Treatments   Labs (all labs ordered are listed, but only abnormal results are displayed) Labs Reviewed  RAPID HIV SCREEN (HIV 1/2 AB+AG)  HEPATITIS PANEL, ACUTE    EKG None  Radiology No results found.  Procedures Procedures    Medications Ordered in ED Medications - No data to display  ED Course/ Medical Decision Making/ A&P                           Medical Decision Making Patient with  needlestick through a glove going through trash   Amount and/or Complexity of Data Reviewed External Data Reviewed: notes.    Details: Previous notes reviewed  Labs: ordered.    Details: HIV is negative   Risk Risk Details: Low risk exposure.  Based on guidelines. PEP not indicated. Labs sent.  Follow up with ID as an outpatient.  Strict return.      Final Clinical Impression(s) / ED Diagnoses Final diagnoses:  Needlestick injury due to hypodermic needle   Return for intractable cough, coughing up blood, fevers > 100.4 unrelieved by medication, shortness of breath, intractable vomiting, chest pain, shortness of breath, weakness, numbness, changes in speech, facial asymmetry, abdominal pain, passing out, Inability to tolerate liquids or food, cough, altered mental status or any concerns. No signs of systemic illness or infection. The patient is nontoxic-appearing on exam and vital signs are within normal limits.  I have reviewed the triage vital signs and the nursing notes. Pertinent labs &  imaging results that were available during my care of the patient were reviewed by me and considered in my medical decision making (see chart for details). After history, exam, and medical workup I feel the patient has been appropriately medically screened and is safe for discharge home. Pertinent diagnoses were discussed with the patient. Patient was given return precautions.  Rx / DC Orders ED Discharge Orders     None         Liadan Guizar, MD 08/08/22 9532

## 2023-04-30 DIAGNOSIS — X58XXXA Exposure to other specified factors, initial encounter: Secondary | ICD-10-CM | POA: Diagnosis not present

## 2023-04-30 DIAGNOSIS — T59811A Toxic effect of smoke, accidental (unintentional), initial encounter: Secondary | ICD-10-CM | POA: Diagnosis not present

## 2023-04-30 DIAGNOSIS — R0602 Shortness of breath: Secondary | ICD-10-CM | POA: Diagnosis not present
